# Patient Record
Sex: Male | Born: 1945 | Smoking: Current every day smoker
Health system: Southern US, Community
[De-identification: ages and names within clinical notes are randomized; demographics above are authoritative.]

## PROBLEM LIST (undated history)

## (undated) DIAGNOSIS — E119 Type 2 diabetes mellitus without complications: Secondary | ICD-10-CM

## (undated) DIAGNOSIS — I10 Essential (primary) hypertension: Secondary | ICD-10-CM

## (undated) DIAGNOSIS — J449 Chronic obstructive pulmonary disease, unspecified: Secondary | ICD-10-CM

## (undated) DIAGNOSIS — I219 Acute myocardial infarction, unspecified: Secondary | ICD-10-CM

## (undated) DIAGNOSIS — I2510 Atherosclerotic heart disease of native coronary artery without angina pectoris: Secondary | ICD-10-CM

## (undated) DIAGNOSIS — E785 Hyperlipidemia, unspecified: Secondary | ICD-10-CM

## (undated) HISTORY — DX: Type 2 diabetes mellitus without complications: E11.9

## (undated) HISTORY — PX: CORONARY ARTERY BYPASS GRAFT: SHX141

## (undated) HISTORY — DX: Essential (primary) hypertension: I10

## (undated) HISTORY — PX: TONSILLECTOMY: SUR1361

## (undated) HISTORY — DX: Atherosclerotic heart disease of native coronary artery without angina pectoris: I25.10

## (undated) HISTORY — DX: Hyperlipidemia, unspecified: E78.5

## (undated) HISTORY — DX: Chronic obstructive pulmonary disease, unspecified: J44.9

## (undated) HISTORY — DX: Acute myocardial infarction, unspecified: I21.9

---

## 2012-09-01 LAB — LIPID PANEL
Cholesterol: 214 mg/dL — AB (ref 0–200)
Triglycerides: 163 mg/dL — AB (ref 40–160)

## 2012-09-01 LAB — CBC AND DIFFERENTIAL
Platelets: 200 10*3/uL (ref 150–399)
WBC: 9.3 10^3/mL

## 2012-09-01 LAB — BASIC METABOLIC PANEL
Creatinine: 1.2 mg/dL (ref 0.6–1.3)
Potassium: 4 mmol/L (ref 3.4–5.3)

## 2013-01-15 ENCOUNTER — Ambulatory Visit: Payer: Self-pay | Admitting: Family Medicine

## 2013-01-19 ENCOUNTER — Ambulatory Visit (INDEPENDENT_AMBULATORY_CARE_PROVIDER_SITE_OTHER): Payer: Medicare Other | Admitting: Family Medicine

## 2013-01-19 ENCOUNTER — Encounter: Payer: Self-pay | Admitting: Family Medicine

## 2013-01-19 VITALS — BP 122/71 | HR 65 | Ht 72.0 in | Wt 282.0 lb

## 2013-01-19 DIAGNOSIS — Z951 Presence of aortocoronary bypass graft: Secondary | ICD-10-CM

## 2013-01-19 DIAGNOSIS — E1129 Type 2 diabetes mellitus with other diabetic kidney complication: Secondary | ICD-10-CM | POA: Insufficient documentation

## 2013-01-19 DIAGNOSIS — N400 Enlarged prostate without lower urinary tract symptoms: Secondary | ICD-10-CM | POA: Insufficient documentation

## 2013-01-19 DIAGNOSIS — R011 Cardiac murmur, unspecified: Secondary | ICD-10-CM

## 2013-01-19 DIAGNOSIS — I10 Essential (primary) hypertension: Secondary | ICD-10-CM | POA: Insufficient documentation

## 2013-01-19 DIAGNOSIS — J449 Chronic obstructive pulmonary disease, unspecified: Secondary | ICD-10-CM

## 2013-01-19 DIAGNOSIS — K219 Gastro-esophageal reflux disease without esophagitis: Secondary | ICD-10-CM

## 2013-01-19 DIAGNOSIS — F411 Generalized anxiety disorder: Secondary | ICD-10-CM

## 2013-01-19 DIAGNOSIS — E119 Type 2 diabetes mellitus without complications: Secondary | ICD-10-CM

## 2013-01-19 MED ORDER — TIOTROPIUM BROMIDE MONOHYDRATE 18 MCG IN CAPS: 18.0000 ug | ORAL_CAPSULE | Freq: Every day | RESPIRATORY_TRACT | Status: DC

## 2013-01-19 MED ORDER — FLUOXETINE HCL 20 MG PO CAPS: 40.0000 mg | ORAL_CAPSULE | Freq: Every day | ORAL | Status: DC

## 2013-01-19 MED ORDER — POTASSIUM CHLORIDE ER 10 MEQ PO CPCR: 10.0000 meq | ORAL_CAPSULE | Freq: Every day | ORAL | Status: DC

## 2013-01-19 MED ORDER — TAMSULOSIN HCL 0.4 MG PO CAPS: 0.4000 mg | ORAL_CAPSULE | Freq: Every day | ORAL | Status: DC

## 2013-01-19 MED ORDER — PITAVASTATIN CALCIUM 4 MG PO TABS: 4.0000 mg | ORAL_TABLET | Freq: Every day | ORAL | Status: DC

## 2013-01-19 MED ORDER — FUROSEMIDE 20 MG PO TABS: 20.0000 mg | ORAL_TABLET | Freq: Every day | ORAL | Status: DC

## 2013-01-19 MED ORDER — CLONAZEPAM 1 MG PO TABS: 1.0000 mg | ORAL_TABLET | Freq: Three times a day (TID) | ORAL | Status: DC | PRN

## 2013-01-19 MED ORDER — LISINOPRIL-HYDROCHLOROTHIAZIDE 20-25 MG PO TABS: 1.0000 | ORAL_TABLET | Freq: Every day | ORAL | Status: DC

## 2013-01-19 MED ORDER — METFORMIN HCL 500 MG PO TABS: 500.0000 mg | ORAL_TABLET | Freq: Two times a day (BID) | ORAL | Status: DC

## 2013-01-19 NOTE — Progress Notes (Signed)
CC: Maxwell Hoffman is a 67 y.o. male is here for Establish Care and needs refills on medicaitions   Subjective: HPI:  Very pleasant 67 year old here to establish care with out acute complaints  He is requesting refills on the majority of his medications  History of coronary artery disease status post CABG 2006. Over the past year he denies any chest pain or limb claudication. He is taking an aspirin a day, he is on a beta blocker, he is on statin he believes his last cholesterol was checked in January he is unsure of the results. He's pretty sure he was not at goal on other statin medication  History of COPD: He continues to smoke, he plans on quitting tomorrow using patches. He reports fatigue with walking the length of asile that improves with resting. Has been using Spiriva daily occasionally uses albuterol does not believe he's been using inhaled corticosteroid for the past year. Denies worsening productive cough or worsening shortness of breath from his subjective Baseline  History of essential hypertension: No outside blood pressures to report has been taking atenolol lisinopril and hydrochlorothiazide for over a year.  History of type 2 diabetes present for over 3 years taking only metformin twice a day he is quite conscious about carbohydrates minimizing intake. No formal exercise routine. He denies tingling or numbness of extremities nor burning sensation. Denies motor or sensory disturbances, vision loss, nor poorly healing wounds  Describes a history of anxiety that has been present ever since [place]. Symptoms are worsened by the recent end of one of his daughter's marriage. Symptoms are slightly improved since this occurred 3 months ago. Symptoms overall are greatly improved by as needed Klonopin and daily Prozac. Denies depression, nor mental disturbance otherwise  BPH: He has been taking Flomax on a daily basis for the last year with a great improvement of prior urinary  frequency and waking at night to urinate. He does not wake at all during the night to urinate anymore. He denies urinary hesitancy nor sensation of incomplete voiding  Review of Systems - General ROS: negative for - chills, fever, night sweats, weight gain or weight loss Ophthalmic ROS: negative for - decreased vision Psychological ROS: negative for - r depression ENT ROS: negative for - hearing change, nasal congestion, tinnitus or allergies Hematological and Lymphatic ROS: negative for - bleeding problems, bruising or swollen lymph nodes Breast ROS: negative Respiratory ROS: no cough, shortness of breath, or wheezing Cardiovascular ROS: no chest pain  Gastrointestinal ROS: no abdominal pain, change in bowel habits, or black or bloody stools Genito-Urinary ROS: negative for - genital discharge, genital ulcers, incontinence or abnormal bleeding from genitals Musculoskeletal ROS: negative for - joint pain or muscle pain Neurological ROS: negative for - headaches or memory loss Dermatological ROS: negative for lumps, mole changes, rash and skin lesion changes Past Medical History  Diagnosis Date  . Hypertension   . Hyperlipidemia   . Heart attack   . Diabetes      Family History  Problem Relation Age of Onset  . Hypertension Father      History  Substance Use Topics  . Smoking status: Current Every Day Smoker    Types: Cigarettes  . Smokeless tobacco: Not on file  . Alcohol Use: Yes     Objective: Filed Vitals:   01/19/13 1023  BP: 122/71  Pulse: 65    General: Alert and Oriented, No Acute Distress HEENT: Pupils equal, round, reactive to light. Conjunctivae clear.  Moist mucous  membrane was unremarkable pharynx Lungs: Clear to auscultation bilaterally, no wheezing/ronchi/rales.  Comfortable work of breathing. Good air movement. Cardiac: Regular rate and rhythm. Normal S1/S2.  No  rubs, nor gallops.  Grade 2/6 holosystolic murmur heard best over left second intercostal  space without carotid bruit Abdomen: Obese soft nontender Extremities: No peripheral edema.  Strong peripheral pulses.  Mental Status: No depression, anxiety, nor agitation. Skin: Warm and dry.  Assessment & Plan: Maxwell Hoffman was seen today for establish care and needs refills on medicaitions.  Diagnoses and associated orders for this visit:  S/P CABG x 3 (2006) - potassium chloride (MICRO-K) 10 MEQ CR capsule; Take 1 capsule (10 mEq total) by mouth daily. - furosemide (LASIX) 20 MG tablet; Take 1 tablet (20 mg total) by mouth daily. - Pitavastatin Calcium (LIVALO) 4 MG TABS; Take 1 tablet (4 mg total) by mouth daily.  Type 2 diabetes mellitus - metFORMIN (GLUCOPHAGE) 500 MG tablet; Take 1 tablet (500 mg total) by mouth 2 (two) times daily with a meal. - Hemoglobin A1c - COMPLETE METABOLIC PANEL WITH GFR - Microalbumin / creatinine urine ratio  BPH (benign prostatic hyperplasia) - tamsulosin (FLOMAX) 0.4 MG CAPS; Take 1 capsule (0.4 mg total) by mouth daily. - COMPLETE METABOLIC PANEL WITH GFR  Essential hypertension, benign - lisinopril-hydrochlorothiazide (PRINZIDE,ZESTORETIC) 20-25 MG per tablet; Take 1 tablet by mouth daily. - COMPLETE METABOLIC PANEL WITH GFR  GERD (gastroesophageal reflux disease)  Generalized anxiety disorder - FLUoxetine (PROZAC) 20 MG capsule; Take 2 capsules (40 mg total) by mouth daily. - clonazePAM (KLONOPIN) 1 MG tablet; Take 1 tablet (1 mg total) by mouth 3 (three) times daily as needed for anxiety.  COPD, moderate - tiotropium (SPIRIVA) 18 MCG inhalation capsule; Place 1 capsule (18 mcg total) into inhaler and inhale daily.  Other Orders - Discontinue: potassium chloride (MICRO-K) 10 MEQ CR capsule; Take 10 mEq by mouth daily. - Discontinue: furosemide (LASIX) 20 MG tablet; Take 20 mg by mouth daily. - Discontinue: metFORMIN (GLUCOPHAGE) 500 MG tablet; Take 500 mg by mouth 2 (two) times daily with a meal. - Discontinue: tamsulosin (FLOMAX) 0.4 MG  CAPS; Take 0.4 mg by mouth. - aspirin 81 MG tablet; Take 81 mg by mouth daily. - Discontinue: lisinopril-hydrochlorothiazide (PRINZIDE,ZESTORETIC) 20-25 MG per tablet; Take 1 tablet by mouth daily. - Cholecalciferol (VITAMIN D-3) 5000 UNITS TABS; Take by mouth. - fish oil-omega-3 fatty acids 1000 MG capsule; Take 2 g by mouth 2 (two) times daily. - Multiple Vitamin (MULTIVITAMIN) tablet; Take 1 tablet by mouth daily. - ranitidine (ZANTAC) 75 MG tablet; Take 75 mg by mouth 2 (two) times daily. - atenolol (TENORMIN) 100 MG tablet; Take 100 mg by mouth daily. - Discontinue: FLUoxetine (PROZAC) 20 MG capsule; Take 20 mg by mouth 2 (two) times daily. - Discontinue: Pitavastatin Calcium (LIVALO) 4 MG TABS; Take by mouth. - Discontinue: tiotropium (SPIRIVA) 18 MCG inhalation capsule; Place 18 mcg into inhaler and inhale daily. - Discontinue: clonazePAM (KLONOPIN) 1 MG tablet; Take 1 mg by mouth 2 (two) times daily as needed for anxiety. - Albuterol Sulfate (VENTOLIN HFA IN); Inhale into the lungs.    History of coronary artery disease status post CABG: Clinically stable, due for lipid panel he would prefer to have this at another time after outside records are reviewed History of type 2 diabetes: Clinically controlled due for A1c, due for urine micro-albumen BPH: Clinically controlled, continue Flomax Essential hypertension: Control, continue atenolol,  hydrochlorothiazide, lisinopril Generalized anxiety disorder: Stable controlled, continue Prozac and as  needed Klonopin COPD: Clinically controlled, congratulated his desire to quit, shortness of breath does not improve after his cessation of smoking return in 2 weeks to discuss inhaled corticosteroids. Cardiac murmur: Awaiting outside echocardiogram results he is unsure of the date  45 minutes spent face-to-face during visit today of which at least 50% was counseling or coordinating care regarding type 2 diabetes, coronary artery disease, BPH,  essential hypertension, generalized anxiety disorder, COPD.   Return in about 3 months (around 04/21/2013).

## 2013-01-20 ENCOUNTER — Encounter: Payer: Self-pay | Admitting: Family Medicine

## 2013-01-20 LAB — COMPLETE METABOLIC PANEL WITH GFR
AST: 22 U/L (ref 0–37)
Albumin: 4.6 g/dL (ref 3.5–5.2)
Alkaline Phosphatase: 54 U/L (ref 39–117)
Calcium: 10.9 mg/dL — ABNORMAL HIGH (ref 8.4–10.5)
Chloride: 97 mEq/L (ref 96–112)
Glucose, Bld: 166 mg/dL — ABNORMAL HIGH (ref 70–99)
Potassium: 3.9 mEq/L (ref 3.5–5.3)
Sodium: 140 mEq/L (ref 135–145)
Total Protein: 7.1 g/dL (ref 6.0–8.3)

## 2013-01-30 ENCOUNTER — Ambulatory Visit: Payer: Medicare Other | Admitting: Family Medicine

## 2013-02-20 ENCOUNTER — Ambulatory Visit: Payer: Medicare HMO | Admitting: Family Medicine

## 2013-02-24 ENCOUNTER — Encounter: Payer: Self-pay | Admitting: Family Medicine

## 2013-02-24 ENCOUNTER — Encounter: Payer: Self-pay | Admitting: *Deleted

## 2013-02-24 LAB — PSA: PSA: 1.99

## 2013-03-17 ENCOUNTER — Telehealth: Payer: Self-pay | Admitting: *Deleted

## 2013-03-17 DIAGNOSIS — E785 Hyperlipidemia, unspecified: Secondary | ICD-10-CM

## 2013-03-17 NOTE — Telephone Encounter (Signed)
Pt called and would like to get a lab order for chol.He is scheduling an appt for Friday and he plans to go get fasting lab work done tomorrow. Lab order faxed

## 2013-03-18 LAB — LIPID PANEL
Cholesterol: 229 mg/dL — ABNORMAL HIGH (ref 0–200)
Total CHOL/HDL Ratio: 5.2 Ratio
Triglycerides: 327 mg/dL — ABNORMAL HIGH (ref <150)

## 2013-03-20 ENCOUNTER — Ambulatory Visit: Payer: Medicare HMO | Admitting: Family Medicine

## 2013-03-23 ENCOUNTER — Encounter: Payer: Self-pay | Admitting: Family Medicine

## 2013-03-23 ENCOUNTER — Ambulatory Visit (INDEPENDENT_AMBULATORY_CARE_PROVIDER_SITE_OTHER): Payer: Medicare HMO | Admitting: Family Medicine

## 2013-03-23 VITALS — BP 144/81 | HR 76 | Wt 284.0 lb

## 2013-03-23 DIAGNOSIS — R011 Cardiac murmur, unspecified: Secondary | ICD-10-CM

## 2013-03-23 DIAGNOSIS — I10 Essential (primary) hypertension: Secondary | ICD-10-CM

## 2013-03-23 DIAGNOSIS — Z951 Presence of aortocoronary bypass graft: Secondary | ICD-10-CM

## 2013-03-23 DIAGNOSIS — F411 Generalized anxiety disorder: Secondary | ICD-10-CM

## 2013-03-23 DIAGNOSIS — E785 Hyperlipidemia, unspecified: Secondary | ICD-10-CM

## 2013-03-23 MED ORDER — FLUOXETINE HCL 20 MG PO CAPS: 60.0000 mg | ORAL_CAPSULE | Freq: Every day | ORAL | Status: DC

## 2013-03-23 MED ORDER — ATORVASTATIN CALCIUM 80 MG PO TABS: 80.0000 mg | ORAL_TABLET | Freq: Every day | ORAL | Status: DC

## 2013-03-23 NOTE — Progress Notes (Signed)
CC: Maxwell Hoffman is a 67 y.o. male is here for f/u labs   Subjective: HPI:  Follow hyperlipidemia: Has been taking livalo 4 mg daily basis for the past 6 months. Denies right upper quadrant pain myalgias nor skin or scleral discoloration. Is trying to get into a walking regimen but having trouble with shortness of breath. Tries to watch what he eats but admits eating largest meal of the day about an hour before sleeping. He does not believe he continued to afford this medication due to price  Essential hypertension: Continues on Lasix atenolol and Lasix no outside blood pressures to report. Has stopped taking lisinopril-hydrochlorothiazide due to intolerant urinary frequency. He is taking old lisinopril regimen on a daily basis he is unsure of the dosage. Denies chest pain, orthopnea, peripheral edema or motor sensory disturbances  Followup general anxiety disorder: Continues on Prozac 40 mg a day. He said his anxiety seems to be worsened since I saw him last due to having to do with discipline concerns of his grandchildren. He is also reporting subjective gloomyness and depression mild in severity relative to absent when I saw him last. Symptoms are significantly improved the more he spends outside. He would like to go back to his 60 mg regimen of Prozac  Patient complains of leg weakness and shortness of breath that occurs after walking a distance of his yard to the mailbox. Symptoms are moderate in severity are absent rest reproduced only with the above. Denies exertional chest pain nor limb claudication. They are present on daily basis this is been worsening over the past 2 months. Denies wheezing, cough, PND nor joint pain    Review Of Systems Outlined In HPI  Past Medical History  Diagnosis Date  . Hypertension   . Hyperlipidemia   . Heart attack   . Diabetes      Family History  Problem Relation Age of Onset  . Hypertension Father      History  Substance Use Topics  . Smoking  status: Current Every Day Smoker    Types: Cigarettes  . Smokeless tobacco: Not on file  . Alcohol Use: Yes     Objective: Filed Vitals:   03/23/13 0958  BP: 144/81  Pulse: 76    General: Alert and Oriented, No Acute Distress HEENT: Pupils equal, round, reactive to light. Conjunctivae clear.  Moist mucous membranes pharynx unremarkable Lungs: Clear to auscultation bilaterally, no wheezing/ronchi/rales.  Comfortable work of breathing. Good air movement. Cardiac: Regular rate and rhythm. Normal S1/S2.  Holosystolic murmur heard best over second left intercostal space nonradiating no rubs Abdomen: Obese soft nontender Extremities: No peripheral edema.  Strong peripheral pulses.  Mental Status: No depression, anxiety, nor agitation. Skin: Warm and dry.  Assessment & Plan: Maxwell Hoffman was seen today for f/u labs.  Diagnoses and associated orders for this visit:  Hyperlipidemia LDL goal < 70 - atorvastatin (LIPITOR) 80 MG tablet; Take 1 tablet (80 mg total) by mouth daily.  Essential hypertension, benign  Generalized anxiety disorder - FLUoxetine (PROZAC) 20 MG capsule; Take 3 capsules (60 mg total) by mouth daily.  S/P CABG x 3 (2006) - 2D Echocardiogram without contrast; Future - Ambulatory referral to Cardiology  Murmur - 2D Echocardiogram without contrast; Future - Ambulatory referral to Cardiology    Hyperlipidemia: uncontrolled chronic condition LDL of 120 less than 70, financially he'll have to stop livalo we will start full dose atorvastatin recheck LDL 3 months Essential hypertension: Uncontrolled, he did not take his medication this  morning, encouraged to take medications before coming to his next visit in one month we'll titrate medications as needed, asked him to call me with his dose of lisinopril Generalized anxiety disorder: Uncontrolled chronic condition will restart former dose of Prozac 60 mg daily readdress in one month Shortness of breath in the setting of  murmur and known heart disease: We will order an echocardiogram today he is agreeable to cardiology referral who has not seen in over a decade   Return in about 4 weeks (around 04/20/2013) for diabetes .

## 2013-03-24 ENCOUNTER — Telehealth: Payer: Self-pay | Admitting: *Deleted

## 2013-03-24 NOTE — Telephone Encounter (Signed)
Prior auth obtained through [facility] for 2D echo.  Authorization number [id number].

## 2013-03-25 ENCOUNTER — Telehealth: Payer: Self-pay | Admitting: *Deleted

## 2013-03-25 ENCOUNTER — Encounter: Payer: Self-pay | Admitting: *Deleted

## 2013-03-25 ENCOUNTER — Inpatient Hospital Stay (HOSPITAL_BASED_OUTPATIENT_CLINIC_OR_DEPARTMENT_OTHER): Admission: RE | Admit: 2013-03-25 | Payer: Medicare HMO | Source: Ambulatory Visit

## 2013-03-25 NOTE — Telephone Encounter (Signed)
Pt called and he is taking lisinopril 20 mg. This has been added to his med list

## 2013-04-01 ENCOUNTER — Ambulatory Visit (HOSPITAL_BASED_OUTPATIENT_CLINIC_OR_DEPARTMENT_OTHER)
Admission: RE | Admit: 2013-04-01 | Discharge: 2013-04-01 | Disposition: A | Discharge status: Home / Self Care | Payer: Medicare HMO | Source: Ambulatory Visit | Attending: Family Medicine | Admitting: Family Medicine

## 2013-04-01 DIAGNOSIS — J449 Chronic obstructive pulmonary disease, unspecified: Secondary | ICD-10-CM | POA: Insufficient documentation

## 2013-04-01 DIAGNOSIS — I080 Rheumatic disorders of both mitral and aortic valves: Secondary | ICD-10-CM | POA: Insufficient documentation

## 2013-04-01 DIAGNOSIS — E785 Hyperlipidemia, unspecified: Secondary | ICD-10-CM | POA: Insufficient documentation

## 2013-04-01 DIAGNOSIS — I079 Rheumatic tricuspid valve disease, unspecified: Secondary | ICD-10-CM | POA: Insufficient documentation

## 2013-04-01 DIAGNOSIS — F17200 Nicotine dependence, unspecified, uncomplicated: Secondary | ICD-10-CM | POA: Insufficient documentation

## 2013-04-01 DIAGNOSIS — Z951 Presence of aortocoronary bypass graft: Secondary | ICD-10-CM

## 2013-04-01 DIAGNOSIS — E669 Obesity, unspecified: Secondary | ICD-10-CM | POA: Insufficient documentation

## 2013-04-01 DIAGNOSIS — J4489 Other specified chronic obstructive pulmonary disease: Secondary | ICD-10-CM | POA: Insufficient documentation

## 2013-04-01 DIAGNOSIS — I059 Rheumatic mitral valve disease, unspecified: Secondary | ICD-10-CM

## 2013-04-01 DIAGNOSIS — R011 Cardiac murmur, unspecified: Secondary | ICD-10-CM

## 2013-04-01 DIAGNOSIS — E119 Type 2 diabetes mellitus without complications: Secondary | ICD-10-CM | POA: Insufficient documentation

## 2013-04-01 DIAGNOSIS — I10 Essential (primary) hypertension: Secondary | ICD-10-CM | POA: Insufficient documentation

## 2013-04-01 NOTE — Progress Notes (Signed)
  Echocardiogram 2D Echocardiogram has been performed.  Cathie Beams 04/01/2013, 10:46 AM

## 2013-04-09 ENCOUNTER — Ambulatory Visit (INDEPENDENT_AMBULATORY_CARE_PROVIDER_SITE_OTHER): Payer: Medicare HMO | Admitting: Family Medicine

## 2013-04-09 ENCOUNTER — Encounter: Payer: Self-pay | Admitting: Family Medicine

## 2013-04-09 VITALS — BP 131/82 | HR 61 | Wt 277.0 lb

## 2013-04-09 DIAGNOSIS — I059 Rheumatic mitral valve disease, unspecified: Secondary | ICD-10-CM

## 2013-04-09 DIAGNOSIS — J449 Chronic obstructive pulmonary disease, unspecified: Secondary | ICD-10-CM

## 2013-04-09 DIAGNOSIS — I340 Nonrheumatic mitral (valve) insufficiency: Secondary | ICD-10-CM

## 2013-04-09 DIAGNOSIS — E119 Type 2 diabetes mellitus without complications: Secondary | ICD-10-CM

## 2013-04-09 LAB — POCT GLYCOSYLATED HEMOGLOBIN (HGB A1C): Hemoglobin A1C: 5.9

## 2013-04-09 NOTE — Progress Notes (Signed)
CC: Coree Brame is a 67 y.o. male is here for discuss echo results and Diabetes   Subjective: HPI:  Patient accompanied by daughter  Patient is requesting clarification of echocardiogram that was ordered 2 weeks ago due to a murmur in the setting of shortness of breath and muscle fatigue with exertion. He plans on starting any intimate relationship with a friend in [place] and is wondering if his heart is healthy enough for sex. Today he reports his shortness of breath is absent he denies dyspnea on exertion but continues to have lower extremity weakness without cramping or claudication nor limb discomfort with elevation. He and his daughter had many well thought out questions regarding his echocardiogram. He denies recent or remote exertional chest pain over the last 2-3 year. He is actively quitting smoking.  He admits to frequent coughing however this is improving as smoking has cut back he denies wheezing orthopnea nor peripheral edema.  Followup type 2 diabetes: He has been taking metformin 500 mg twice a day without vision loss, motor sensory disturbances, polyuria polyphasia or polydipsia    Review Of Systems Outlined In HPI  Past Medical History  Diagnosis Date  . Hypertension   . Hyperlipidemia   . Heart attack   . Diabetes      Family History  Problem Relation Age of Onset  . Hypertension Father      History  Substance Use Topics  . Smoking status: Current Every Day Smoker    Types: Cigarettes  . Smokeless tobacco: Not on file  . Alcohol Use: Yes     Objective: Filed Vitals:   04/09/13 1017  BP: 131/82  Pulse: 61    Vital signs reviewed. General: Alert and Oriented, No Acute Distress HEENT: Pupils equal, round, reactive to light. Conjunctivae clear.  External ears unremarkable.  Moist mucous membranes. Lungs: Clear and comfortable work of breathing, speaking in full sentences without accessory muscle use. No wheezing rhonchi nor rales Cardiac: Regular  rate and rhythm. Grade 2 holosystolic murmur over left second intercostal space Neuro: CN II-XII grossly intact, gait normal. Extremities: No peripheral edema.  Strong peripheral pulses.  Mental Status: No depression, anxiety, nor agitation. Logical though process. Skin: Warm and dry.  Assessment & Plan: Trinidad was seen today for discuss echo results and diabetes.  Diagnoses and associated orders for this visit:  Type 2 diabetes mellitus - POCT HgB A1C - Urine Microalbumin w/creat. ratio  COPD, moderate  Mitral regurgitation    Type 2 diabetes: A1c 5.9 well controlled continue metformin COPD: Stable We discussed starting an inhaled corticosteroid should shortness of breath return however given lack of shortness of breath he would prefer to avoid this at this time Mitral regurgitation: Time was taken to discuss the patient and his daughter findings of echocardiogram showing EF 60% mild mitral and tricuspid regurgitation but overall no other significant abnormalities.  Have encouraged patient to engage in daily walking regimen lengthening the duration overall 10% a week, we discussed that he would definitely need cardiac stress test should he develop chest pain or dyspnea on exertion and we discussed signs and symptoms of limb claudication it would require further ABI. He has a cardiology appointment in 2 weeks and we'll continue discussing her health and whether or not he can engage in sex, we discussed that it's likely a stress test would be needed before clearance for sexual activity  40 minutes spent face-to-face during visit today of which at least 50% was counseling or coordinating care  regarding mitral regurgitation, COPD, type 2 diabetes, exercise/sexual counseling.   Return in about 4 weeks (around 05/07/2013).

## 2013-04-10 ENCOUNTER — Encounter: Payer: Self-pay | Admitting: Family Medicine

## 2013-04-10 DIAGNOSIS — R809 Proteinuria, unspecified: Secondary | ICD-10-CM | POA: Insufficient documentation

## 2013-04-10 LAB — MICROALBUMIN / CREATININE URINE RATIO: Microalb Creat Ratio: 60.6 mg/g — ABNORMAL HIGH (ref 0.0–30.0)

## 2013-04-14 ENCOUNTER — Ambulatory Visit: Payer: Medicare HMO | Admitting: Family Medicine

## 2013-04-22 ENCOUNTER — Encounter: Payer: Self-pay | Admitting: Cardiology

## 2013-04-22 ENCOUNTER — Other Ambulatory Visit: Payer: Self-pay | Admitting: Family Medicine

## 2013-04-22 ENCOUNTER — Ambulatory Visit (INDEPENDENT_AMBULATORY_CARE_PROVIDER_SITE_OTHER): Payer: Medicare HMO | Admitting: Cardiology

## 2013-04-22 VITALS — BP 116/72 | HR 60 | Ht 72.0 in | Wt 262.0 lb

## 2013-04-22 DIAGNOSIS — Z720 Tobacco use: Secondary | ICD-10-CM

## 2013-04-22 DIAGNOSIS — R0989 Other specified symptoms and signs involving the circulatory and respiratory systems: Secondary | ICD-10-CM

## 2013-04-22 DIAGNOSIS — E785 Hyperlipidemia, unspecified: Secondary | ICD-10-CM

## 2013-04-22 DIAGNOSIS — I059 Rheumatic mitral valve disease, unspecified: Secondary | ICD-10-CM

## 2013-04-22 DIAGNOSIS — Z951 Presence of aortocoronary bypass graft: Secondary | ICD-10-CM

## 2013-04-22 DIAGNOSIS — I2510 Atherosclerotic heart disease of native coronary artery without angina pectoris: Secondary | ICD-10-CM

## 2013-04-22 DIAGNOSIS — F17200 Nicotine dependence, unspecified, uncomplicated: Secondary | ICD-10-CM

## 2013-04-22 DIAGNOSIS — I340 Nonrheumatic mitral (valve) insufficiency: Secondary | ICD-10-CM

## 2013-04-22 DIAGNOSIS — I10 Essential (primary) hypertension: Secondary | ICD-10-CM

## 2013-04-22 NOTE — Assessment & Plan Note (Signed)
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

## 2013-04-22 NOTE — Progress Notes (Signed)
HPI: 67 year-old male for evaluation of murmur and CAD. Patient is status post myocardial infarction followed by PCI in 1993 and 1998. In 2006 he had coronary artery bypass graft. All of those procedures were performed in [place]. No records available. He was recently found to have a murmur. Echocardiogram in August of 2014 showed normal LV function and mild left ventricular hypertrophy. The aortic root was mildly dilated. There was moderate left atrial enlargement and mild mitral regurgitation. He denies dyspnea, chest pain, palpitations or syncope.  Current Outpatient Prescriptions  Medication Sig Dispense Refill  . Albuterol Sulfate (VENTOLIN HFA IN) Inhale into the lungs.      Marland Kitchen aspirin 81 MG tablet Take 81 mg by mouth daily.      Marland Kitchen atenolol (TENORMIN) 100 MG tablet Take 100 mg by mouth daily.      Marland Kitchen atorvastatin (LIPITOR) 80 MG tablet Take 1 tablet (80 mg total) by mouth daily.  90 tablet  3  . Cholecalciferol (VITAMIN D-3) 5000 UNITS TABS Take by mouth.      . clonazePAM (KLONOPIN) 1 MG tablet Take 1 tablet (1 mg total) by mouth 3 (three) times daily as needed for anxiety.  90 tablet  2  . fish oil-omega-3 fatty acids 1000 MG capsule Take 2 g by mouth 2 (two) times daily.      Marland Kitchen FLUoxetine (PROZAC) 20 MG capsule Take 3 capsules (60 mg total) by mouth daily.  270 capsule  1  . furosemide (LASIX) 20 MG tablet Take 1 tablet (20 mg total) by mouth daily.  90 tablet  1  . lisinopril (PRINIVIL,ZESTRIL) 20 MG tablet Take 20 mg by mouth daily.      . metFORMIN (GLUCOPHAGE) 500 MG tablet Take 1 tablet (500 mg total) by mouth 2 (two) times daily with a meal.  180 tablet  1  . Multiple Vitamins-Minerals (CENTRUM SILVER ADULT 50+ PO) Take by mouth daily.      . potassium chloride (MICRO-K) 10 MEQ CR capsule Take 1 capsule (10 mEq total) by mouth daily.  90 capsule  1  . ranitidine (ZANTAC) 75 MG tablet Take 75 mg by mouth 2 (two) times daily.      . tamsulosin (FLOMAX) 0.4 MG CAPS Take 1 capsule  (0.4 mg total) by mouth daily.  90 capsule  1  . tiotropium (SPIRIVA) 18 MCG inhalation capsule Place 1 capsule (18 mcg total) into inhaler and inhale daily.  30 capsule  5   No current facility-administered medications for this visit.    Allergies  Allergen Reactions  . Other     ANT  . Wax [Beeswax]     Past Medical History  Diagnosis Date  . Hypertension   . Hyperlipidemia   . Heart attack   . Diabetes   . CAD (coronary artery disease)   . COPD (chronic obstructive pulmonary disease)     Past Surgical History  Procedure Laterality Date  . Coronary artery bypass graft      x 3  . Tonsillectomy      History   Social History  . Marital Status: Widowed    Spouse Name: N/A    Number of Children: 3  . Years of Education: N/A   Occupational History  .      Retired   Social History Main Topics  . Smoking status: Current Every Day Smoker    Types: Cigarettes  . Smokeless tobacco: Not on file  . Alcohol Use: Yes  Comment: 3 drinks per night  . Drug Use: Not on file  . Sexual Activity: Not Currently   Other Topics Concern  . Not on file   Social History Narrative  . No narrative on file    Family History  Problem Relation Age of Onset  . Hypertension Father     ROS: no fevers or chills, productive cough, hemoptysis, dysphasia, odynophagia, melena, hematochezia, dysuria, hematuria, rash, seizure activity, orthopnea, PND, pedal edema, claudication. Remaining systems are negative.  Physical Exam:   Blood pressure 116/72, pulse 60, height 6' (1.829 m), weight 262 lb (118.842 kg).  General:  Well developed/well nourished in NAD Skin warm/dry Patient not depressed No peripheral clubbing Back-normal HEENT-normal/normal eyelids Neck supple/normal carotid upstroke bilaterally; no bruits; no JVD; no thyromegaly chest - CTA/ normal expansion CV - RRR/normal S1 and S2; no rubs or gallops;  PMI nondisplaced, 2/6 systolic murmur left sternal border. S2 is not  diminished. 2/6 systolic murmur apex. Abdomen -NT/ND, no HSM, no mass, + bowel sounds, positive bruit 2+ femoral pulses, no bruits Ext-no edema, chords, 2+ DP Neuro-grossly nonfocal  ECG sinus rhythm at a rate of 60. Occasional PVCs. Normal axis. RV conduction delay. Cannot rule out prior septal infarct.

## 2013-04-22 NOTE — Assessment & Plan Note (Signed)
Blood pressure controlled. Continue present medications. Potassium and renal function monitored by primary care. 

## 2013-04-22 NOTE — Telephone Encounter (Signed)
Maxwell Hoffman, Rx placed in in-box ready for pickup/faxing.  

## 2013-04-22 NOTE — Assessment & Plan Note (Signed)
Patient counseled on discontinuing. 

## 2013-04-22 NOTE — Patient Instructions (Addendum)
Your physician wants you to follow-up in: ONE YEAR WITH DR Jens Som IN [place] You will receive a reminder letter in the mail two months in advance. If you don't receive a letter, please call our office to schedule the follow-up appointment.   Your physician has requested that you have an abdominal aorta duplex. During this test, an ultrasound is used to evaluate the aorta. Allow 30 minutes for this exam. Do not eat after midnight the day before and avoid carbonated beverages   Your physician has requested that you have a lexiscan myoview. For further information please visit [email or web address] Please follow instruction sheet, as given.

## 2013-04-22 NOTE — Assessment & Plan Note (Signed)
Continue aspirin and statin. Schedule Myoview for risk stratification. 

## 2013-04-22 NOTE — Assessment & Plan Note (Signed)
Schedule ultrasound to exclude abdominal aortic aneurysm.

## 2013-04-22 NOTE — Assessment & Plan Note (Signed)
Lipids and liver monitored by primary care. Continue statin.

## 2013-04-22 NOTE — Telephone Encounter (Signed)
rx faxed

## 2013-04-24 ENCOUNTER — Ambulatory Visit: Payer: Medicare HMO | Admitting: Family Medicine

## 2013-04-28 ENCOUNTER — Ambulatory Visit (INDEPENDENT_AMBULATORY_CARE_PROVIDER_SITE_OTHER): Payer: Medicare HMO | Admitting: Family Medicine

## 2013-04-28 ENCOUNTER — Encounter: Payer: Self-pay | Admitting: Family Medicine

## 2013-04-28 ENCOUNTER — Ambulatory Visit (INDEPENDENT_AMBULATORY_CARE_PROVIDER_SITE_OTHER): Payer: Medicare HMO

## 2013-04-28 VITALS — BP 140/82 | HR 59 | Wt 273.0 lb

## 2013-04-28 DIAGNOSIS — M25519 Pain in unspecified shoulder: Secondary | ICD-10-CM

## 2013-04-28 DIAGNOSIS — M25511 Pain in right shoulder: Secondary | ICD-10-CM

## 2013-04-28 DIAGNOSIS — Z23 Encounter for immunization: Secondary | ICD-10-CM

## 2013-04-28 NOTE — Progress Notes (Signed)
CC: Maxwell Hoffman is a 67 y.o. male is here for neck pain on the rt side and rt shouler pain   Subjective: HPI:  Complains of right shoulder pain localized to the top of the shoulder that radiates to the lateral aspect of the shoulder and also towards the neck traveling along the upper trapezius. Symptoms came on 2-3 weeks ago have been present on a daily basis but for some unknown reason for absent right now, he said no interventions as of yet. Denies trauma but did replace the toilet just prior to onset of symptoms. Pain is described only as pain it is mild in severity. He denies motor or sensory disturbances in the right upper extremity nor any were for that matter. He reports objective full range of motion in the right shoulder. Approximately one year ago he fractured the head of the humerus.  He's never had this pain before. It can be present any time of the day. Denies chest pain, shortness of breath, nor warmth swelling or redness of the affected joint, he's positive the pain does not radiate beyond his shoulder distally   Review Of Systems Outlined In HPI  Past Medical History  Diagnosis Date  . Hypertension   . Hyperlipidemia   . Heart attack   . Diabetes   . CAD (coronary artery disease)   . COPD (chronic obstructive pulmonary disease)      Family History  Problem Relation Age of Onset  . Hypertension Father      History  Substance Use Topics  . Smoking status: Current Every Day Smoker    Types: Cigarettes  . Smokeless tobacco: Not on file  . Alcohol Use: Yes     Comment: 3 drinks per night     Objective: Filed Vitals:   04/28/13 1443  BP: 140/82  Pulse: 59    General: Alert and Oriented, No Acute Distress HEENT: Pupils equal, round, reactive to light. Conjunctivae clear.  Moist mucous membranes pharynx unremarkable Lungs: Clear to auscultation bilaterally, no wheezing/ronchi/rales.  Comfortable work of breathing. Good air movement. Cardiac: Regular rate and  rhythm. Normal S1/S2.  No murmurs, rubs, nor gallops.   Extremities: No peripheral edema.  Strong peripheral pulses. Exam of the right upper extremity: Juanetta Hoffman is positive, empty can positive, negative speeds, negative crossarm, negative Neer's, no pain with palpation of a.c. joint, pain is reproduced with internal rotation of the humerus especially behind the back. He has full range of motion and strength of all 4 rotator cuff Back: There is no C-spine spinous process tenderness nor pain with palpation of the trapezius muscle belly on the right Mental Status: No depression, anxiety, nor agitation. Skin: Warm and dry.  Assessment & Plan: Amol was seen today for neck pain on the rt side and rt shouler pain.  Diagnoses and associated orders for this visit:  Right shoulder pain - DG Shoulder Right; Future    There was a suspicion of right rotator cuff tendinopathy/tendinitis an x-ray was obtained for a better view of the inferior a.c. joint which is unremarkable. Discussed oral and intra-articular inflammatory options patient prefers injection today, triamcinolone delivered without complication. Signs and symptoms requring emergent/urgent reevaluation were discussed with the patient.  He will receive flu shot today  Return if symptoms worsen or fail to improve.  Subacromial Shoulder Injection Procedure Note  Pre-operative Diagnosis: right rotator cuff tendinitis  Post-operative Diagnosis: same  Indications: pain  Anesthesia: topical cold spray  Procedure Details   Verbal consent was obtained  for the procedure. The shoulder was prepped with Betadine and the skin was anesthetized. A 25 gauge needle was advanced into the subacromial space through posterior approach without difficulty  The space was then injected with 2ml 1% lidocaine and 2 ml of triamcinolone (KENALOG) 40mg /ml. The injection site was cleansed with isopropyl alcohol and a dressing was applied.  Complications:  None;  patient tolerated the procedure well.

## 2013-05-06 ENCOUNTER — Ambulatory Visit (HOSPITAL_COMMUNITY): Payer: Medicare HMO | Attending: Cardiovascular Disease | Admitting: Radiology

## 2013-05-06 ENCOUNTER — Ambulatory Visit (HOSPITAL_BASED_OUTPATIENT_CLINIC_OR_DEPARTMENT_OTHER): Payer: Medicare HMO

## 2013-05-06 VITALS — BP 121/83 | Ht 72.0 in | Wt 270.0 lb

## 2013-05-06 DIAGNOSIS — I252 Old myocardial infarction: Secondary | ICD-10-CM | POA: Insufficient documentation

## 2013-05-06 DIAGNOSIS — R0609 Other forms of dyspnea: Secondary | ICD-10-CM | POA: Insufficient documentation

## 2013-05-06 DIAGNOSIS — I2510 Atherosclerotic heart disease of native coronary artery without angina pectoris: Secondary | ICD-10-CM

## 2013-05-06 DIAGNOSIS — I10 Essential (primary) hypertension: Secondary | ICD-10-CM | POA: Insufficient documentation

## 2013-05-06 DIAGNOSIS — E119 Type 2 diabetes mellitus without complications: Secondary | ICD-10-CM | POA: Insufficient documentation

## 2013-05-06 DIAGNOSIS — E785 Hyperlipidemia, unspecified: Secondary | ICD-10-CM | POA: Insufficient documentation

## 2013-05-06 DIAGNOSIS — R0602 Shortness of breath: Secondary | ICD-10-CM

## 2013-05-06 DIAGNOSIS — F17200 Nicotine dependence, unspecified, uncomplicated: Secondary | ICD-10-CM | POA: Insufficient documentation

## 2013-05-06 DIAGNOSIS — Z951 Presence of aortocoronary bypass graft: Secondary | ICD-10-CM

## 2013-05-06 DIAGNOSIS — R0989 Other specified symptoms and signs involving the circulatory and respiratory systems: Secondary | ICD-10-CM | POA: Insufficient documentation

## 2013-05-06 DIAGNOSIS — R002 Palpitations: Secondary | ICD-10-CM | POA: Insufficient documentation

## 2013-05-06 MED ORDER — TECHNETIUM TC 99M SESTAMIBI GENERIC - CARDIOLITE
33.0000 | Freq: Once | INTRAVENOUS | Status: AC | PRN
  Administered 2013-05-06: 33 via INTRAVENOUS

## 2013-05-06 MED ORDER — REGADENOSON 0.4 MG/5ML IV SOLN
0.4000 mg | Freq: Once | INTRAVENOUS | Status: AC
  Administered 2013-05-06: 0.4 mg via INTRAVENOUS

## 2013-05-06 MED ORDER — TECHNETIUM TC 99M SESTAMIBI GENERIC - CARDIOLITE
11.0000 | Freq: Once | INTRAVENOUS | Status: AC | PRN
  Administered 2013-05-06: 11 via INTRAVENOUS

## 2013-05-06 NOTE — Progress Notes (Signed)
[facility] SITE 3 NUCLEAR MED [address] [place], [place] [address] [phone]    Cardiology Nuclear Med Study  Maxwell Hoffman is a 67 y.o. 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 IV 0.9% NS with Angio Cath:  22g  IV Site: L Antecubital x 1, tolerated well IV Started by:  Irean Hong, RN  Chest Size (in):  50 Cup Size: n/a  Height: 6' (1.829 m)  Weight:  270 lb (122.471 kg)  BMI:  Body mass index is 36.61 kg/(m^2). Tech Comments:  Atenolol this am; held metformin and lasix    Nuclear Med Study 1 or 2 day study: 1 day  Stress Test Type:  Eugenie Birks  Reading MD: Charlton Haws, MD  Order Authorizing Provider:  Olga Millers, MD  Resting Radionuclide: Technetium 13m Sestamibi  Resting Radionuclide Dose: 11.0 mCi   Stress Radionuclide:  Technetium 41m Sestamibi  Stress Radionuclide Dose: 33.0 mCi           Stress Protocol Rest HR: 54 Stress HR: 67  Rest BP: 120/83 Stress BP: 139/78  Exercise Time (min): 2:00 METS: 1.6   Predicted Max HR: 154 bpm % Max HR: 43.51 bpm Rate Pressure Product: 9313   Dose of Adenosine (mg):  n/a Dose of Lexiscan: 0.4 mg  Dose of Atropine (mg): n/a Dose of Dobutamine: n/a mcg/kg/min (at max HR)  Stress Test Technologist: Milana Na, EMT-P  Nuclear Technologist:  Domenic Polite, CNMT     Rest Procedure:  Myocardial perfusion imaging was performed at rest 45 minutes following the intravenous administration of Technetium 13m Sestamibi. Rest ECG: NSR [id number]  Stress Procedure:  The patient received IV Lexiscan 0.4 mg over  15-seconds with concurrent low level exercise and then Technetium 33m Sestamibi was injected at 30-seconds while the patient continued walking one more minute.  Quantitative spect images were obtained after a 45-minute delay. Stress ECG: No significant change from baseline ECG  QPS Raw Data Images:  Normal; no motion artifact; normal heart/lung ratio. Stress Images:  Decreased uptake in inferolateral wall Rest Images:  Decreased uptake in inferolateral wall Subtraction (SDS):  There is a fixed defect that is most consistent with a previous infarction. Transient Ischemic Dilatation (Normal <1.22):  n/a Lung/Heart Ratio (Normal <0.45):  0.47  Quantitative Gated Spect Images QGS EDV:  257 ml QGS ESV:  110 ml  Impression Exercise Capacity:  Lexiscan with low level exercise. BP Response:  Normal blood pressure response. Clinical Symptoms:  There is dyspnea. ECG Impression:  No significant ST segment change suggestive of ischemia. 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  LV Ejection Fraction: 57%.  LV Wall Motion:  Calculated as normal but LV appears dilated and extent of infarct suggests lower EF   [employer]

## 2013-05-07 ENCOUNTER — Telehealth: Payer: Self-pay | Admitting: *Deleted

## 2013-05-07 MED ORDER — LISINOPRIL 20 MG PO TABS: 20.0000 mg | ORAL_TABLET | Freq: Every day | ORAL | Status: DC

## 2013-05-07 NOTE — Telephone Encounter (Signed)
Patient's daughter states that the pt needs a refill on his lisinopril.  I don't see that we've filled it before, are you ok with this? Please advise

## 2013-05-07 NOTE — Telephone Encounter (Signed)
Perfectly fine, sent to [facility] on [address]

## 2013-05-07 NOTE — Telephone Encounter (Signed)
Pt.notified

## 2013-05-08 ENCOUNTER — Telehealth: Payer: Self-pay | Admitting: Cardiology

## 2013-05-08 NOTE — Telephone Encounter (Signed)
Pt was to call debra if not heard from her by today, stress test Wednesday, wants results

## 2013-05-08 NOTE — Telephone Encounter (Signed)
Spoke with pt, aware of ultrasound and nuclear results

## 2013-05-15 ENCOUNTER — Telehealth: Payer: Self-pay

## 2013-05-15 MED ORDER — AMOXICILLIN-POT CLAVULANATE 500-125 MG PO TABS: ORAL_TABLET | ORAL | Status: DC

## 2013-05-15 NOTE — Telephone Encounter (Signed)
Left message on vm to call back and let us know what pharm we need to fax rx into

## 2013-05-15 NOTE — Telephone Encounter (Signed)
Pt called back at 210 and left a message stating he saw that I called and to call him back; see previous phone note

## 2013-05-15 NOTE — Telephone Encounter (Signed)
Maxwell Hoffman, Rx placed in in-box ready for pickup/faxing. I'm not sure if [name] is in [place] or still in town right now.

## 2013-05-15 NOTE — Telephone Encounter (Signed)
Patient states his tooth is worse now and would like an antibiotic. He going to try to see the Dentist when he returns from [place].

## 2013-05-25 ENCOUNTER — Telehealth: Payer: Self-pay

## 2013-05-25 DIAGNOSIS — M25511 Pain in right shoulder: Secondary | ICD-10-CM

## 2013-05-25 DIAGNOSIS — K0889 Other specified disorders of teeth and supporting structures: Secondary | ICD-10-CM

## 2013-05-25 MED ORDER — AMOXICILLIN-POT CLAVULANATE 500-125 MG PO TABS: ORAL_TABLET | ORAL | Status: AC

## 2013-05-25 MED ORDER — AMOXICILLIN-POT CLAVULANATE 500-125 MG PO TABS: ORAL_TABLET | ORAL | Status: DC

## 2013-05-25 MED ORDER — MELOXICAM 15 MG PO TABS: 15.0000 mg | ORAL_TABLET | Freq: Every day | ORAL | Status: DC | PRN

## 2013-05-25 NOTE — Telephone Encounter (Signed)
Rx of meloxicam for pain and augmentin for tooth infection sent to [facility] on [address]

## 2013-05-25 NOTE — Telephone Encounter (Signed)
He is back from [place] and would like and antibiotic called in for his tooth. He would also like something for his pain. See last note.   The pharmacy is [facility].

## 2013-05-26 NOTE — Telephone Encounter (Signed)
Pt.notified

## 2013-06-01 ENCOUNTER — Telehealth: Payer: Self-pay | Admitting: Family Medicine

## 2013-06-01 MED ORDER — CLONAZEPAM 1 MG PO TABS: ORAL_TABLET | ORAL | Status: DC

## 2013-06-01 NOTE — Telephone Encounter (Signed)
[place] controlled sub database reviewed

## 2013-06-02 ENCOUNTER — Ambulatory Visit: Payer: Medicare HMO | Admitting: Family Medicine

## 2013-06-03 ENCOUNTER — Ambulatory Visit (INDEPENDENT_AMBULATORY_CARE_PROVIDER_SITE_OTHER): Payer: Medicare HMO | Admitting: Family Medicine

## 2013-06-03 ENCOUNTER — Telehealth: Payer: Self-pay | Admitting: Family Medicine

## 2013-06-03 ENCOUNTER — Encounter: Payer: Self-pay | Admitting: Family Medicine

## 2013-06-03 VITALS — BP 124/77 | HR 60 | Temp 98.0°F | Wt 263.0 lb

## 2013-06-03 DIAGNOSIS — I5023 Acute on chronic systolic (congestive) heart failure: Secondary | ICD-10-CM

## 2013-06-03 DIAGNOSIS — G4733 Obstructive sleep apnea (adult) (pediatric): Secondary | ICD-10-CM

## 2013-06-03 DIAGNOSIS — J9620 Acute and chronic respiratory failure, unspecified whether with hypoxia or hypercapnia: Secondary | ICD-10-CM

## 2013-06-03 DIAGNOSIS — I509 Heart failure, unspecified: Secondary | ICD-10-CM | POA: Insufficient documentation

## 2013-06-03 DIAGNOSIS — Z09 Encounter for follow-up examination after completed treatment for conditions other than malignant neoplasm: Secondary | ICD-10-CM

## 2013-06-03 DIAGNOSIS — N400 Enlarged prostate without lower urinary tract symptoms: Secondary | ICD-10-CM

## 2013-06-03 DIAGNOSIS — I10 Essential (primary) hypertension: Secondary | ICD-10-CM

## 2013-06-03 MED ORDER — TAMSULOSIN HCL 0.4 MG PO CAPS: 0.4000 mg | ORAL_CAPSULE | Freq: Every day | ORAL | Status: DC

## 2013-06-03 MED ORDER — DOXAZOSIN MESYLATE 4 MG PO TABS: 4.0000 mg | ORAL_TABLET | Freq: Every day | ORAL | Status: DC

## 2013-06-03 MED ORDER — FREESTYLE SYSTEM KIT: 1.0000 | PACK | Status: AC | PRN

## 2013-06-03 NOTE — Progress Notes (Signed)
CC: Maxwell Hoffman is a 67 y.o. male is here for hospital f/u   Subjective: HPI:  Hospital followup [facility] October 23-24  Patient reports that when he was visiting [place] for the past 2 weeks he has stopped taking Lasix due to urinary frequency and urinary incontinence due to urgency. He believes he did not take Lasix for most of the time he was away from home. The longer that he was on vacation the more he noticed that he was becoming fatigued and developing shortness of breath. Shortness of breath got to the point where he was becoming confused and short of breath even at rest described as severe in severity.  This prompted EMS to arrive at his house on the 23rd. He was admitted for dyspnea in the setting of acute on chronic systolic congestive heart failure and acute on chronic respiratory failure  Acute on chronic systolic congestive heart failure: An echocardiogram was obtained showing 40-45% EF left ventricle along with severe mitral regurgitation. This was obtained prior to diuresis. 6 pounds of fluid was diuresis with IV furosemide receive twice a day. He was discharged approximately 36 hours after admission after there was no change in renal function and where serial troponins were negative.  He states he feels remarkably better ever since the day of discharge since fluid was pulled off of him. Prior to admission he was experiencing abdominal bloating, PND, and orthopnea without peripheral edema. This is now completely resolved 100%. He continues on Lasix 20 mg a day taken every morning. He reports that he is urinating every 15 minutes and often has small accidents symptoms are worse in the 4 hours after taking Lasix but still moderate to severe in the later part of the day. Denies dysuria but admits to sensation of incomplete voiding  Acute on chronic respiratory failure: Oxygen saturation was below 89% on admission at discharge it was 90% on room air. Patient states he is  currently experiencing no shortness of breath whatsoever. Denies fever, chills, cough, wheezing since time of discharge.   Review Of Systems Outlined In HPI  Past Medical History  Diagnosis Date  . Hypertension   . Hyperlipidemia   . Heart attack   . Diabetes   . CAD (coronary artery disease)   . COPD (chronic obstructive pulmonary disease)      Family History  Problem Relation Age of Onset  . Hypertension Father      History  Substance Use Topics  . Smoking status: Current Every Day Smoker    Types: Cigarettes  . Smokeless tobacco: Not on file  . Alcohol Use: Yes     Comment: 3 drinks per night     Objective: Filed Vitals:   06/03/13 1113  BP: 124/77  Pulse: 60  Temp: 98 F (36.7 C)    General: Alert and Oriented, No Acute Distress HEENT: Pupils equal, round, reactive to light. Conjunctivae clear.  Moist mucous membranes pharynx unremarkable Lungs: Clear to auscultation bilaterally, no wheezing/ronchi/rales.  Comfortable work of breathing. Good air movement. Cardiac: Regular rate and rhythm. Normal S1/S2.  No rubs or gallops, there is a holosystolic 2/6 murmur heard at the second left intercostal space no carotid bruits  Abdomen: Obese soft nontender Extremities: No peripheral edema.  Strong peripheral pulses.  Mental Status: No depression, anxiety, nor agitation. Skin: Warm and dry.  Assessment & Plan: [name] was seen today for hospital f/u.  Diagnoses and associated orders for this visit:  Hospital discharge follow-up  Essential hypertension,  benign - BASIC METABOLIC PANEL WITH GFR  Acute on chronic systolic heart failure - BASIC METABOLIC PANEL WITH GFR  Acute-on-chronic respiratory failure  BPH (benign prostatic hyperplasia) - doxazosin (CARDURA) 4 MG tablet; Take 1 tablet (4 mg total) by mouth at bedtime. To help with prostate. - tamsulosin (FLOMAX) 0.4 MG CAPS capsule; Take 1 capsule (0.4 mg total) by mouth daily.  CHF, chronic  Other  Orders - glucose monitoring kit (FREESTYLE) monitoring kit; 1 each by Does not apply route as needed for other. Use to test blood sugar fasting daily.   Please dispense test strips and lancets to last 30 days.    Patient.patient and his daughter had many very well thought out questions regarding results that were not reviewed with him while hospitalized. Time was taken to answer all questions. Discussed that findings on echocardiogram while admitted had deteriorated relative to that which was obtained in the summer. Discussed with at least a part of these findings would be due to being fluid overloaded however I did stress followup with his cardiologist as I suspect repeat echocardiogram may be warranted in a euvolemic state. Encouraged him to continue on atenolol and lisinopril and Lasix 20 mg. Total he does urinary frequency is due to less than optimal control of his BPH, start doxazosin 2 mg daily if not improving urinary symptoms Increase to 4 mg daily provided blood pressure tolerates this, nor agrees to help with daily blood pressure monitoring. Encouraged patient to weigh himself daily and to take extra dose of Lasix if he gains more than 2 pounds overnight also to followup with that occurs.  Acute congestive heart failure and respiratory failure seems to have resolved.  They've requested a prescription for glucometer and would like his CPAP machine assessed as they are worried may be malfunctioning, no particular complaints about how it may be malfunctioning  60 minutes spent face-to-face during visit today of which at least 50% was counseling or coordinating care regarding acute on chronic systolic congestive heart failure, acute on chronic respiratory failure, BPH   Return in about 4 weeks (around 07/01/2013) for BPH FU.

## 2013-06-03 NOTE — Telephone Encounter (Signed)
Sue Lush, Can you see if [facility] can help with a CPAP problem Mr. Sexson is having.  He thinks his unit may be outdated and malfunctioning.  It was originally given to him by a company 20 years ago in [place] where he no longer resides.

## 2013-06-04 NOTE — Telephone Encounter (Signed)
So...unfortonatley the only agency that will work with [employer] is [employer]. [employer] will repair and machine every five years as long as it doesn't exceed the cost of the machine. This requires a face to face visit where where this is discussed and an order

## 2013-06-05 MED ORDER — AMBULATORY NON FORMULARY MEDICATION: Status: DC

## 2013-06-05 NOTE — Telephone Encounter (Signed)
I think our visit from earlier this week can count as a face to face, can you please relay a verbal order to inspect and repair his machine.

## 2013-06-05 NOTE — Telephone Encounter (Signed)
Called [employer] and they do not repair old machine.pt will have to get a new one. Faxed order to [phone]

## 2013-06-08 ENCOUNTER — Ambulatory Visit: Payer: Medicare HMO | Admitting: Cardiology

## 2013-06-09 ENCOUNTER — Ambulatory Visit: Payer: Medicare HMO | Admitting: Family Medicine

## 2013-06-09 NOTE — Telephone Encounter (Signed)
Sue Lush, Will you please let Mr. Jagoda know that Maxwell Hoffman is requiring a sleep study so that his insurance will cover a new CPAP machine.  This was never received from outside records which i'm not surprised about since it was probably done so long ago.  Unless he has the original sleep study data he'll need a new sleep study.  In case this is needed i've placed an order through [facility], they'll call him about scheduling.

## 2013-06-10 NOTE — Telephone Encounter (Signed)
Left detailed message on vm.

## 2013-06-17 ENCOUNTER — Ambulatory Visit (INDEPENDENT_AMBULATORY_CARE_PROVIDER_SITE_OTHER): Payer: Medicare HMO | Admitting: Cardiology

## 2013-06-17 ENCOUNTER — Encounter: Payer: Self-pay | Admitting: Cardiology

## 2013-06-17 VITALS — BP 132/80 | HR 64 | Ht 72.0 in | Wt 268.8 lb

## 2013-06-17 DIAGNOSIS — E785 Hyperlipidemia, unspecified: Secondary | ICD-10-CM

## 2013-06-17 DIAGNOSIS — I059 Rheumatic mitral valve disease, unspecified: Secondary | ICD-10-CM

## 2013-06-17 DIAGNOSIS — I2510 Atherosclerotic heart disease of native coronary artery without angina pectoris: Secondary | ICD-10-CM

## 2013-06-17 DIAGNOSIS — I509 Heart failure, unspecified: Secondary | ICD-10-CM

## 2013-06-17 DIAGNOSIS — I10 Essential (primary) hypertension: Secondary | ICD-10-CM

## 2013-06-17 DIAGNOSIS — Z720 Tobacco use: Secondary | ICD-10-CM

## 2013-06-17 DIAGNOSIS — F17200 Nicotine dependence, unspecified, uncomplicated: Secondary | ICD-10-CM

## 2013-06-17 DIAGNOSIS — I340 Nonrheumatic mitral (valve) insufficiency: Secondary | ICD-10-CM

## 2013-06-17 NOTE — Assessment & Plan Note (Signed)
Continue aspirin and statin. 

## 2013-06-17 NOTE — Assessment & Plan Note (Signed)
Continue statin. 

## 2013-06-17 NOTE — Assessment & Plan Note (Signed)
Patient continues to have a significant mitral regurgitation murmur on examination. Recent echocardiogram at [facility] suggested severe MR. We will obtain those records for review. I will see him back in 4 weeks. He may require transesophageal echocardiogram to better assess the severity of MR, morphology of valve and whether he will need mitral valve surgery.

## 2013-06-17 NOTE — Patient Instructions (Signed)
Your physician recommends that you continue on your current medications as directed. Please refer to the Current Medication list given to you today.   Your physician recommends that you schedule a follow-up appointment in: 4 weeks with Dr. Jens Som on Wednesday, December 17 @ 3:00

## 2013-06-17 NOTE — Assessment & Plan Note (Signed)
Patient counseled on discontinuing. 

## 2013-06-17 NOTE — Assessment & Plan Note (Signed)
Patient's CHF symptoms are much improved now that he has resumed his Lasix. Check potassium and renal function today.

## 2013-06-17 NOTE — Progress Notes (Signed)
HPI: FU CAD. Patient is status post myocardial infarction followed by PCI in 1993 and 1998. In 2006 he had coronary artery bypass graft. All of those procedures were performed in [place]. No records available. Echocardiogram in August of 2014 showed normal LV function and mild left ventricular hypertrophy. The aortic root was mildly dilated. There was moderate left atrial enlargement and mild mitral regurgitation. Abdominal ultrasound in October of 2014 showed no aneurysm. Nuclear study in October of 2014 showed an inferior lateral infarct but no ischemia. Ejection fraction was 57% with left ventricular enlargement. Patient admitted to [facility] in October of 2014 with congestive heart failure. He had discontinued his Lasix on a trip to [place]. An echocardiogram by report showed an ejection fraction of 40-45% with severe mitral regurgitation. He was diuresed with improvement. Since that time has mild dyspnea on exertion but back to baseline. No orthopnea, PND, pedal edema, chest pain or syncope.   Current Outpatient Prescriptions  Medication Sig Dispense Refill  . Albuterol Sulfate (VENTOLIN HFA IN) Inhale into the lungs.      . AMBULATORY NON FORMULARY MEDICATION CPAP machine  1 each  0  . aspirin 81 MG tablet Take 81 mg by mouth daily.      Marland Kitchen atenolol (TENORMIN) 100 MG tablet Take 100 mg by mouth daily.      Marland Kitchen atorvastatin (LIPITOR) 80 MG tablet Take 1 tablet (80 mg total) by mouth daily.  90 tablet  3  . Cholecalciferol (VITAMIN D-3) 5000 UNITS TABS Take by mouth.      . clonazePAM (KLONOPIN) 1 MG tablet TAKE 1 TABLET BY MOUTH THREE TIMES DAILY AS NEEDED ANXIETY  90 tablet  0  . doxazosin (CARDURA) 4 MG tablet Take 2 mg by mouth at bedtime. To help with prostate.      . fish oil-omega-3 fatty acids 1000 MG capsule Take 2 g by mouth 2 (two) times daily.      Marland Kitchen FLUoxetine (PROZAC) 20 MG capsule Take 3 capsules (60 mg total) by mouth daily.  270 capsule  1  . furosemide (LASIX)  20 MG tablet Take 1 tablet (20 mg total) by mouth daily.  90 tablet  1  . glucose monitoring kit (FREESTYLE) monitoring kit 1 each by Does not apply route as needed for other. Use to test blood sugar fasting daily.   Please dispense test strips and lancets to last 30 days.  1 each  0  . lisinopril (PRINIVIL,ZESTRIL) 20 MG tablet Take 1 tablet (20 mg total) by mouth daily.  90 tablet  0  . metFORMIN (GLUCOPHAGE) 500 MG tablet Take 1 tablet (500 mg total) by mouth 2 (two) times daily with a meal.  180 tablet  1  . Multiple Vitamins-Minerals (CENTRUM SILVER ADULT 50+ PO) Take by mouth daily.      . potassium chloride (MICRO-K) 10 MEQ CR capsule Take 1 capsule (10 mEq total) by mouth daily.  90 capsule  1  . ranitidine (ZANTAC) 75 MG tablet Take 75 mg by mouth 2 (two) times daily.      . tamsulosin (FLOMAX) 0.4 MG CAPS capsule Take 1 capsule (0.4 mg total) by mouth daily.  90 capsule  1  . tiotropium (SPIRIVA) 18 MCG inhalation capsule Place 1 capsule (18 mcg total) into inhaler and inhale daily.  30 capsule  5   No current facility-administered medications for this visit.     Past Medical History  Diagnosis Date  . Hypertension   .  Hyperlipidemia   . Heart attack   . Diabetes   . CAD (coronary artery disease)   . COPD (chronic obstructive pulmonary disease)     Past Surgical History  Procedure Laterality Date  . Coronary artery bypass graft      x 3  . Tonsillectomy      History   Social History  . Marital Status: Widowed    Spouse Name: N/A    Number of Children: 3  . Years of Education: N/A   Occupational History  .      Retired   Social History Main Topics  . Smoking status: Current Every Day Smoker    Types: Cigarettes  . Smokeless tobacco: Not on file  . Alcohol Use: Yes     Comment: 3 drinks per night  . Drug Use: Not on file  . Sexual Activity: Not Currently   Other Topics Concern  . Not on file   Social History Narrative  . No narrative on file    ROS:  no fevers or chills, productive cough, hemoptysis, dysphasia, odynophagia, melena, hematochezia, dysuria, hematuria, rash, seizure activity, orthopnea, PND, pedal edema, claudication. Remaining systems are negative.  Physical Exam: Well-developed well-nourished in no acute distress.  Skin is warm and dry.  HEENT is normal.  Neck is supple.  Chest is clear to auscultation with normal expansion.  Cardiovascular exam is regular rate and rhythm. 3/6 systolic murmur apex. Abdominal exam nontender or distended. No masses palpated. Extremities show no edema. neuro grossly intact

## 2013-06-17 NOTE — Assessment & Plan Note (Signed)
Blood pressure controlled. Continue present medications. 

## 2013-06-18 ENCOUNTER — Encounter: Payer: Self-pay | Admitting: Family Medicine

## 2013-06-18 ENCOUNTER — Ambulatory Visit (INDEPENDENT_AMBULATORY_CARE_PROVIDER_SITE_OTHER): Payer: Medicare HMO | Admitting: Family Medicine

## 2013-06-18 VITALS — BP 141/86 | HR 53 | Wt 270.0 lb

## 2013-06-18 DIAGNOSIS — K089 Disorder of teeth and supporting structures, unspecified: Secondary | ICD-10-CM

## 2013-06-18 DIAGNOSIS — I2510 Atherosclerotic heart disease of native coronary artery without angina pectoris: Secondary | ICD-10-CM

## 2013-06-18 DIAGNOSIS — I10 Essential (primary) hypertension: Secondary | ICD-10-CM

## 2013-06-18 DIAGNOSIS — I059 Rheumatic mitral valve disease, unspecified: Secondary | ICD-10-CM

## 2013-06-18 DIAGNOSIS — N400 Enlarged prostate without lower urinary tract symptoms: Secondary | ICD-10-CM

## 2013-06-18 DIAGNOSIS — Z4589 Encounter for adjustment and management of other implanted devices: Secondary | ICD-10-CM

## 2013-06-18 DIAGNOSIS — Z951 Presence of aortocoronary bypass graft: Secondary | ICD-10-CM

## 2013-06-18 DIAGNOSIS — I340 Nonrheumatic mitral (valve) insufficiency: Secondary | ICD-10-CM

## 2013-06-18 DIAGNOSIS — K0889 Other specified disorders of teeth and supporting structures: Secondary | ICD-10-CM

## 2013-06-18 DIAGNOSIS — H6120 Impacted cerumen, unspecified ear: Secondary | ICD-10-CM

## 2013-06-18 LAB — BASIC METABOLIC PANEL WITH GFR
Chloride: 103 mEq/L (ref 96–112)
Creat: 1.04 mg/dL (ref 0.50–1.35)
GFR, Est African American: 86 mL/min
GFR, Est Non African American: 74 mL/min
Glucose, Bld: 94 mg/dL (ref 70–99)
Potassium: 4.9 mEq/L (ref 3.5–5.3)
Sodium: 140 mEq/L (ref 135–145)

## 2013-06-18 MED ORDER — FUROSEMIDE 20 MG PO TABS: ORAL_TABLET | ORAL | Status: DC

## 2013-06-18 MED ORDER — TRAMADOL HCL 50 MG PO TABS: 50.0000 mg | ORAL_TABLET | Freq: Two times a day (BID) | ORAL | Status: DC | PRN

## 2013-06-18 NOTE — Progress Notes (Signed)
CC: Maxwell Hoffman is a 67 y.o. male is here for Follow-up   Subjective: HPI:  BPH followup: Since starting Flomax and doxazosin 2 mg he has noticed a drastic improvement in BPH symptoms. Symptoms began to improve to-3 days after starting the above medications. He is awakening to urinate only once a night. He can go 2-3 hours between needing to urinate, he thinks that he is not urinating more than 8 times a day. He denies incomplete emptying sensation, dribbling, weak stream, urinary hesitancy, nor urinary urgency  Patient is worried about the possibility that he may need his mitral valve replaced, he was given this news yesterday by his cardiologist. He would like a second opinion with the cardiology team that took care of him while he was at [facility]    Followup essential hypertension: He has been taking his blood pressure at home is consistently systolic 110-120 diastolic 70s.    Complains of left ear pain described as mild in severity and itching. Not radiating. Improves with inserting a Q-tip into the ear nothing else makes better or worse. Has been present for one week becoming more annoying on a daily basis. Reports that he has tympanostomy tubes that were put in over 5 years ago.  Patient completed dental pain that has been present for one year current interventions included Panorex while at [facility] last month to rule out abscess. Pain is described only as pain moderate in severity worse first thing in the morning worse when chewing on the right side of the mouth localized to the most posterior and upper right molar. Has been unable to find local dentist that will take his insurance  Denies fevers, chills, unintentional weight gain or loss, shortness of breath, orthopnea, peripheral edema, abdominal bloating. Denies motor or sensory disturbances   Review Of Systems Outlined In HPI  Past Medical History  Diagnosis Date  . Hypertension   . Hyperlipidemia   .  Heart attack   . Diabetes   . CAD (coronary artery disease)   . COPD (chronic obstructive pulmonary disease)      Family History  Problem Relation Age of Onset  . Hypertension Father      History  Substance Use Topics  . Smoking status: Current Every Day Smoker    Types: Cigarettes  . Smokeless tobacco: Not on file  . Alcohol Use: Yes     Comment: 3 drinks per night     Objective: Filed Vitals:   06/18/13 1055  BP: 141/86  Pulse: 53    General: Alert and Oriented, No Acute Distress HEENT: Pupils equal, round, reactive to light. Conjunctivae clear.  External ears unremarkable, right canals clear with intact TMs and appropriate landmarks there is a blue tympanostomy tube at 7:00 position.  There is a mild cerumen impaction in the left ear canal obstructing the tympanic membrane . Pink inferior turbinates.  Moist mucous membranes, pharynx without inflammation nor lesions.  Neck supple without palpable lymphadenopathy nor abnormal masses. Poor dentition with multiple caries in all molars Lungs: Clear to auscultation bilaterally, no wheezing/ronchi/rales.  Comfortable work of breathing. Good air movement. Cardiac: Regular rate and rhythm. Grade 3/6 holosystolic murmur best heard at the left second intercostal space Extremities: No peripheral edema.  Strong peripheral pulses.  Mental Status: No depression, anxiety, nor agitation. Skin: Warm and dry.  Assessment & Plan: Maxwell Hoffman was seen today for follow-up.  Diagnoses and associated orders for this visit:  Essential hypertension, benign  S/P CABG x  3 (2006) - furosemide (LASIX) 20 MG tablet; 1-2 tablets by mouth every morning as needed for swelling. - Ambulatory referral to Cardiology  Pain, dental - traMADol (ULTRAM) 50 MG tablet; Take 1 tablet (50 mg total) by mouth every 12 (twelve) hours as needed.  Cerumen impaction, unspecified laterality - Ambulatory referral to ENT  Tympanostomy tube check - Ambulatory referral to  ENT  Mitral regurgitation - Ambulatory referral to Cardiology  CAD (coronary artery disease) - Ambulatory referral to Cardiology  BPH (benign prostatic hyperplasia)    Essential hypertension: Controlled continue doxazosin, lisinopril, atenolol Dental pain: Tramadol as needed encouraged by dentist as soon as possible avoid nonsteroidal anti-inflammatories given congestive heart failure Cerumen impaction: Referral to ENT, patient refuses to use hydrogen peroxide to loosen wax in left ear therefore referral has been placed as I'm afraid I may damage the tympanic membrane if the cerumen is adherent to the tympanostomy tube Mitral regurgitation: Referral to [facility] cardiology PPH: Controlled continue Flomax and doxazosin  40 minutes spent face-to-face during visit today of which at least 50% was counseling or coordinating care regarding essential hypertension, dental pain, cerumen impaction, mitral regurgitation, coronary artery disease BPH.   Return in about 4 weeks (around 07/16/2013).

## 2013-06-22 ENCOUNTER — Telehealth: Payer: Self-pay | Admitting: Cardiology

## 2013-06-22 NOTE — Telephone Encounter (Signed)
Records rec From [facility], Will Hold Onto Until Victorio Palm back in Office 06/22/13/KM

## 2013-06-22 NOTE — Telephone Encounter (Signed)
ROI faxed to [facility] at [phone]

## 2013-07-07 ENCOUNTER — Ambulatory Visit (INDEPENDENT_AMBULATORY_CARE_PROVIDER_SITE_OTHER): Payer: Medicare HMO | Admitting: Family Medicine

## 2013-07-07 ENCOUNTER — Encounter: Payer: Self-pay | Admitting: Family Medicine

## 2013-07-07 VITALS — BP 128/78 | HR 58 | Wt 268.0 lb

## 2013-07-07 DIAGNOSIS — L819 Disorder of pigmentation, unspecified: Secondary | ICD-10-CM

## 2013-07-07 DIAGNOSIS — R51 Headache: Secondary | ICD-10-CM

## 2013-07-07 MED ORDER — CYCLOBENZAPRINE HCL 10 MG PO TABS: ORAL_TABLET | ORAL | Status: DC

## 2013-07-07 MED ORDER — BUTALBITAL-APAP-CAFFEINE 50-325-40 MG PO TABS: 1.0000 | ORAL_TABLET | Freq: Four times a day (QID) | ORAL | Status: DC | PRN

## 2013-07-07 NOTE — Progress Notes (Signed)
CC: Maxwell Hoffman is a 67 y.o. male is here for Headaches   Subjective: HPI:  Complains of a daily headache that has been present for the last 2 weeks seems to be worse early in the morning described as tightness in the back of his neck that radiates up the back of his head into both temples described only as tightness and pressure. Improves with caffeine, has also tried ibuprofen with 10 mg of oxycodone that his daughter gave him this will take the edge off for approximately 8 hours. Denies motor or sensory disturbances prior during or after the headache. Denies vision loss or confusion prior during or after the headaches. Denies recent trauma to head. Denies fevers, chills, nasal congestion. Symptoms can occur anytime of the day. Moderate in severity at its worse.  He points out a lesion on his left forehead near the temple that has been present for an unknown amount of time. He seems to be itching him but not bothering him otherwise. No interventions as of yet. Symptoms are worse when wearing glasses denies history of skin cancer personal or family.   Review Of Systems Outlined In HPI  Past Medical History  Diagnosis Date  . Hypertension   . Hyperlipidemia   . Heart attack   . Diabetes   . CAD (coronary artery disease)   . COPD (chronic obstructive pulmonary disease)      Family History  Problem Relation Age of Onset  . Hypertension Father      History  Substance Use Topics  . Smoking status: Current Every Day Smoker    Types: Cigarettes  . Smokeless tobacco: Not on file  . Alcohol Use: Yes     Comment: 3 drinks per night     Objective: Filed Vitals:   07/07/13 1513  BP: 128/78  Pulse: 58    General: Alert and Oriented, No Acute Distress HEENT: Pupils equal, round, reactive to light. Conjunctivae clear.  Ex moist because membranes pharynx unremarkable Lungs: Clear to auscultation bilaterally, no wheezing/ronchi/rales.  Comfortable work of breathing. Good air  movement. Cardiac: Regular rate and rhythm. Normal S1/S2.  No murmurs, rubs, nor gallops.   Neuro: CN II-XII grossly intact, full strength/rom of all four extremities, C5/L4/S1 DTRs 2/4 bilaterally, gait normal, Extremities: No peripheral edema.  Strong peripheral pulses.  Mental Status: No depression, anxiety, nor agitation. Skin: Warm and dry. He has a slightly raised pearly lesion with a central dark hue with mild telangiectasia on the left temple this is approximately 0.5 millimeter raised and 4 mm diameter  Assessment & Plan: Maxwell Hoffman was seen today for headaches.  Diagnoses and associated orders for this visit:  Pigmented skin lesion - Dermatology pathology  Headache(784.0) - butalbital-acetaminophen-caffeine (FIORICET, ESGIC) 50-325-40 MG per tablet; Take 1 tablet by mouth every 6 (six) hours as needed for headache. - cyclobenzaprine (FLEXERIL) 10 MG tablet; Take a half to a full tab every 8-12 hours only as needed for neck spasm, may cause sedation.    Pigmented skin lesion that is suspicious for basal cell carcinoma a shave biopsy was obtained for definitive diagnosis Headache: Suspect tension headache, and urged to stop oxycodone instead tried Flexeril and if needed Fioricet. If not improving by Friday we'll provide prednisone taper as I suspect he might be having a rebound component to his headaches  Return in about 3 months (around 10/05/2013).  Shave Biopsy Procedure Note  Pre-operative Diagnosis: Suspicious lesion  Post-operative Diagnosis: same  Locations:Left temple region  Indications: rule out [facility]  Anesthesia: topical cold spray  Procedure Details  History of allergy to iodine: no  Patient informed of the risks (including bleeding and infection) and benefits of the  procedure and Verbal informed consent obtained.  The lesion and surrounding area were given a sterile prep using chlorhexidine and draped in the usual sterile fashion. A scalpel was used to shave an  area of skin approximately 0.5cm by 0.5cm.  Hemostasis achieved with pressure. Antibiotic ointment and a sterile dressing applied.  The specimen was sent for pathologic examination. The patient tolerated the procedure well.  EBL: 1 ml  Findings: Successful sample  Condition: Stable  Complications: none.  Plan: 1. Instructed to keep the wound dry and covered for 24-48h and clean thereafter. 2. Warning signs of infection were reviewed.   3. Recommended that the patient use OTC analgesics as needed for pain.  4. Return in 1 week. PRN

## 2013-07-10 ENCOUNTER — Other Ambulatory Visit: Payer: Self-pay | Admitting: Family Medicine

## 2013-07-10 DIAGNOSIS — C4491 Basal cell carcinoma of skin, unspecified: Secondary | ICD-10-CM

## 2013-07-13 ENCOUNTER — Telehealth: Payer: Self-pay | Admitting: *Deleted

## 2013-07-13 ENCOUNTER — Other Ambulatory Visit: Payer: Self-pay | Admitting: Family Medicine

## 2013-07-13 ENCOUNTER — Telehealth: Payer: Self-pay | Admitting: Family Medicine

## 2013-07-13 DIAGNOSIS — C4491 Basal cell carcinoma of skin, unspecified: Secondary | ICD-10-CM

## 2013-07-13 NOTE — Telephone Encounter (Signed)
Pt left a message stating he was scheduled for testing with Dr. Leeann Must( a cardiologist) for Tuesday morning. Pt states he doesn't feel the need to go the cardiologist and wanted to have a conversation with Dr. Ivan Anchors about it.Fowarding this note just make Dr. Ivan Anchors aware that pt prob will not keep his appt

## 2013-07-13 NOTE — Telephone Encounter (Signed)
Maxwell Hoffman, Will you please let Maxwell Hoffman know that his biopsy showed that his skin lesion is a basal cell carcinoma.  I've placed a dermatology referral so they can discuss surgical vs. Topical interventions to get rid of it.

## 2013-07-13 NOTE — Telephone Encounter (Signed)
Pt aware. Dr, Linford Arnold reviewed this Friday

## 2013-07-13 NOTE — Telephone Encounter (Signed)
Noted, If he calls back my recommendation would be to follow Dr. Gevena Barre recommendations.

## 2013-07-14 ENCOUNTER — Encounter: Payer: Self-pay | Admitting: Family Medicine

## 2013-07-14 ENCOUNTER — Other Ambulatory Visit: Payer: Self-pay | Admitting: Family Medicine

## 2013-07-15 ENCOUNTER — Encounter: Payer: Self-pay | Admitting: Family Medicine

## 2013-07-15 ENCOUNTER — Encounter (HOSPITAL_BASED_OUTPATIENT_CLINIC_OR_DEPARTMENT_OTHER): Payer: Medicare HMO

## 2013-07-15 DIAGNOSIS — G4733 Obstructive sleep apnea (adult) (pediatric): Secondary | ICD-10-CM | POA: Insufficient documentation

## 2013-07-15 DIAGNOSIS — J449 Chronic obstructive pulmonary disease, unspecified: Secondary | ICD-10-CM

## 2013-07-15 DIAGNOSIS — C4491 Basal cell carcinoma of skin, unspecified: Secondary | ICD-10-CM | POA: Insufficient documentation

## 2013-07-22 ENCOUNTER — Ambulatory Visit: Payer: Medicare HMO | Admitting: Cardiology

## 2013-07-23 ENCOUNTER — Ambulatory Visit: Payer: Medicare HMO | Admitting: Family Medicine

## 2013-07-31 ENCOUNTER — Encounter: Payer: Self-pay | Admitting: *Deleted

## 2013-08-05 ENCOUNTER — Telehealth: Payer: Self-pay | Admitting: *Deleted

## 2013-08-05 NOTE — Telephone Encounter (Signed)
PA obtained for Tamsulosin HCL. Good thru 08/05/2014.  Meyer Cory, LPN

## 2013-08-10 ENCOUNTER — Other Ambulatory Visit: Payer: Self-pay | Admitting: Family Medicine

## 2013-08-11 ENCOUNTER — Ambulatory Visit (INDEPENDENT_AMBULATORY_CARE_PROVIDER_SITE_OTHER): Payer: Medicare HMO | Admitting: Family Medicine

## 2013-08-11 ENCOUNTER — Encounter: Payer: Self-pay | Admitting: Family Medicine

## 2013-08-11 VITALS — BP 143/84 | HR 69 | Wt 270.0 lb

## 2013-08-11 DIAGNOSIS — K0889 Other specified disorders of teeth and supporting structures: Secondary | ICD-10-CM

## 2013-08-11 DIAGNOSIS — K089 Disorder of teeth and supporting structures, unspecified: Secondary | ICD-10-CM

## 2013-08-11 DIAGNOSIS — R51 Headache: Secondary | ICD-10-CM

## 2013-08-11 DIAGNOSIS — F411 Generalized anxiety disorder: Secondary | ICD-10-CM

## 2013-08-11 DIAGNOSIS — I509 Heart failure, unspecified: Secondary | ICD-10-CM

## 2013-08-11 DIAGNOSIS — I059 Rheumatic mitral valve disease, unspecified: Secondary | ICD-10-CM

## 2013-08-11 DIAGNOSIS — I340 Nonrheumatic mitral (valve) insufficiency: Secondary | ICD-10-CM

## 2013-08-11 MED ORDER — CYCLOBENZAPRINE HCL 10 MG PO TABS: ORAL_TABLET | ORAL | Status: DC

## 2013-08-11 MED ORDER — FLUOXETINE HCL 20 MG PO CAPS: 60.0000 mg | ORAL_CAPSULE | Freq: Every day | ORAL | Status: DC

## 2013-08-11 MED ORDER — TRAMADOL HCL 50 MG PO TABS: 100.0000 mg | ORAL_TABLET | Freq: Three times a day (TID) | ORAL | Status: DC | PRN

## 2013-08-11 MED ORDER — VENLAFAXINE HCL ER 75 MG PO CP24: ORAL_CAPSULE | ORAL | Status: DC

## 2013-08-11 NOTE — Progress Notes (Signed)
CC: Maxwell Hoffman is a 68 y.o. male is here for discuss cardiac questions   Subjective: HPI:  Followup congestive heart failure with mitral regurgitation: Patient returns after second opinion with Dr. Mauricio Hoffman who put forth a plan of transthoracic echocardiogram followed by possible catheterization to evaluate for median candidacy of mitral valve replacement due to severe mitral valve regurgitation.  Dr. Stanford Hoffman had also set the stage that the patient may need mitral valve replacement. Patient admits that he is taking aback and intimidated about the possibility of a surgery and feels that too much information is coming from at once. He is extremely reluctant about having any cardiac surgery in the near or distant future due to surgical complications that occurred with cardiac surgery in the past.  He is asking me today what he thinks he should do in his current situation. Since I saw him last he denies any shortness of breath, cough, abdominal bloating, chest pain, orthopnea, peripheral edema. He is weighing himself on a daily basis and taking Lasix most days of the week depending on whether or not he's gained more than 1 pound overnight.  Patient with continued headaches that are localized in the back of the head that radiate up towards both temples they occur most days of the week. It appears he is having rebound headaches when he is taking Fioricet which mildly improves the pain for only about one or 2 hours. Tramadol is helping somewhat. He denies any new character or severity to his headaches. He denies any motor or sensory disturbances. Flexeril seems to be providing him with the most benefit.  He feels that his ": Nerves " and depression are somewhat stable right now however he is wondering if his depression is considering to his reluctance of having any cardiac interventions    Review Of Systems Outlined In HPI  Past Medical History  Diagnosis Date  . Hypertension   . Hyperlipidemia   .  Heart attack   . Diabetes   . CAD (coronary artery disease)   . COPD (chronic obstructive pulmonary disease)      Family History  Problem Relation Age of Onset  . Hypertension Father      History  Substance Use Topics  . Smoking status: Current Every Day Smoker    Types: Cigarettes  . Smokeless tobacco: Not on file  . Alcohol Use: Yes     Comment: 3 drinks per night     Objective: Filed Vitals:   08/11/13 1311  BP: 143/84  Pulse: 69    General: Alert and Oriented, No Acute Distress HEENT: Pupils equal, round, reactive to light. Conjunctivae clear.   moist because membranes with poor dentition  Lungs: Clear to auscultation bilaterally, no wheezing/ronchi/rales.  Comfortable work of breathing. Good air movement. Cardiac: Regular rate and rhythm.  grade 3/6 crescendo decrescendo murmur heard over the left second intercostal space    Abdomen: Obese and soft  Extremities: No peripheral edema.  Strong peripheral pulses.  Mental Status: No depression, anxiety, nor agitation. Skin: Warm and dry.  Assessment & Plan: Maxwell Hoffman was seen today for discuss cardiac questions.  Diagnoses and associated orders for this visit:  Mitral regurgitation - 2D Echocardiogram without contrast; Future  CHF, chronic - 2D Echocardiogram without contrast; Future  Generalized anxiety disorder - FLUoxetine (PROZAC) 20 MG capsule; Take 3 capsules (60 mg total) by mouth daily. Taper: Two by mouth daily for one week then one by mouth daily for the last week.  Pain, dental -  traMADol (ULTRAM) 50 MG tablet; Take 2 tablets (100 mg total) by mouth every 8 (eight) hours as needed for moderate pain.  Headache(784.0) - cyclobenzaprine (FLEXERIL) 10 MG tablet; Take a half to a full tab every 8-12 hours only as needed for neck spasm, may cause sedation.  Other Orders - venlafaxine XR (EFFEXOR XR) 75 MG 24 hr capsule; One by mouth daily starting at end of prozac taper, then two by mouth every  morning.     headache: Stop Fioricet due to rebound headache, continue cyclobenzaprine, may use full dose tramadol for treatment of headaches, strongly recommended not to use any any opiates for headache control. (Please disregard dental pain diagnosis above) Generalized anxiety disorder with depression: Stable however if he is able to get the same benefit from Effexor this may provide him better headache prevention. Discussed a taper down of Prozac and start up of Effexor. CHF with mitral regurgitation: Discussed with patient that it would be in his best interest to go along with the recommendations set forth by the first and second opinion from his cardiologist. Patient is adamant about not having any invasive cardiac procedure even a TEE at this current time. We discussed that his mitral regurgitation will not repair itself on its own and will set him up for worsening congestive heart failure, the only way to correct this would be to go forth with surgery appears to be a candidate. Patient and daughter would like to know if there is any noninvasive tests they can perform to check his current heart function other than TEE or catheterization, they're specifically asking if they can have another TTE.  The patient states that if the efficiency of his heart has improved and he was still hold off on further intervention however if declining or unimproved from his hospitalization after diuresis he would strongly consider returning to see cardiology  40 minutes spent face-to-face during visit today of which at least 50% was counseling or coordinating care regarding: 1. Mitral regurgitation   2. CHF, chronic   3. Generalized anxiety disorder   4. Pain, dental   5. Headache(784.0)      Return in about 4 weeks (around 09/08/2013).

## 2013-08-17 ENCOUNTER — Telehealth: Payer: Self-pay | Admitting: Family Medicine

## 2013-08-17 ENCOUNTER — Telehealth: Payer: Self-pay | Admitting: *Deleted

## 2013-08-17 DIAGNOSIS — R519 Headache, unspecified: Secondary | ICD-10-CM | POA: Insufficient documentation

## 2013-08-17 DIAGNOSIS — R51 Headache: Secondary | ICD-10-CM

## 2013-08-17 MED ORDER — METAXALONE 800 MG PO TABS: ORAL_TABLET | ORAL | Status: DC

## 2013-08-17 NOTE — Telephone Encounter (Signed)
Pt notified rx faxed.

## 2013-08-17 NOTE — Telephone Encounter (Signed)
Maxwell Hoffman, Will you please let Maxwell Hoffman know that his insurance is no longer covering cyclobenzaprine.  We can send a similar alternate to his [facility] if he'd like (placed in your inbox).

## 2013-08-17 NOTE — Telephone Encounter (Signed)
LMOM informing pt not to go to [facility] for Echo because we are still waiting on Auth # and [facility] won't do echo w/o number.  Oscar La, LPN

## 2013-08-19 ENCOUNTER — Telehealth: Payer: Self-pay | Admitting: *Deleted

## 2013-08-19 NOTE — Telephone Encounter (Signed)
Echo approved. Auth # [id number].  Oscar La, LPN

## 2013-08-24 ENCOUNTER — Telehealth: Payer: Self-pay | Admitting: Family Medicine

## 2013-08-24 NOTE — Telephone Encounter (Signed)
Pt notified and states he will contact Dr. Shearon Stalls office

## 2013-08-24 NOTE — Telephone Encounter (Signed)
Maxwell Hoffman, Will you please let Maxwell Hoffman know that his echocardiogram from January 15th compared to that on October 24th is overall unchanged.  Ejection fraction remains at 45-50% and the mitral valve regurgitation is again classified as severe.   Based on Dr. Shearon Stalls recommendations I would still recommend he strongly consider working up his candidacy for a mitral valve replacement with Dr. Shearon Stalls group.

## 2013-09-03 ENCOUNTER — Telehealth: Payer: Self-pay | Admitting: Family Medicine

## 2013-09-03 DIAGNOSIS — C4491 Basal cell carcinoma of skin, unspecified: Secondary | ICD-10-CM

## 2013-09-03 NOTE — Telephone Encounter (Signed)
Patient called request a referral to [facility]. Thanks

## 2013-09-07 ENCOUNTER — Other Ambulatory Visit: Payer: Self-pay | Admitting: Family Medicine

## 2013-09-08 ENCOUNTER — Other Ambulatory Visit: Payer: Self-pay | Admitting: Family Medicine

## 2013-09-08 NOTE — Telephone Encounter (Signed)
Maxwell Hoffman, Can you clarify if this refill request is correct, I was informed by his insurance is no longer covering cyclobenzaprine and metaxalone was sent instead on Aug 17 2013

## 2013-09-09 ENCOUNTER — Telehealth: Payer: Self-pay | Admitting: Family Medicine

## 2013-09-09 MED ORDER — CLONAZEPAM 1 MG PO TABS: ORAL_TABLET | ORAL | Status: DC

## 2013-09-09 NOTE — Telephone Encounter (Signed)
rx faxed

## 2013-09-09 NOTE — Telephone Encounter (Signed)
Maxwell Hoffman, Rx placed in in-box ready for pickup/faxing.  

## 2013-09-10 ENCOUNTER — Telehealth: Payer: Self-pay | Admitting: *Deleted

## 2013-09-10 MED ORDER — CYCLOBENZAPRINE HCL 10 MG PO TABS: ORAL_TABLET | ORAL | Status: DC

## 2013-09-10 NOTE — Telephone Encounter (Signed)
Pt called and states [facility] sent a request to [place] for refill on the flexreril and has not heard anything.pt states he takes the flexeril along with the tramadol for his headaches. After looking at his med list it looks like skelaxin was rx'ed for HA's last because the insurance no longer covers flexeril.   called and spoke with pt's daughter. Insurance will not cover either skelaxin or flexeril. Out of the two flexeril is only $25 out of pocket for the patient. My question is do I need to change the directions to say take one tablet every 8 hours for headaches and how many are you ok with him getting per month?

## 2013-09-10 NOTE — Telephone Encounter (Signed)
Maxwell Hoffman, Please also refer to my response on 09/08/13 for the refill request regarding this issue. I'm assuming flexeril is the preferred muscle relaxer for his tension headaches therefore I'll send in a Rx ([employer] [facility] [employer]) for this with my recommended dosage.  Can you please update him or his family.

## 2013-09-11 NOTE — Telephone Encounter (Signed)
Left message on vm

## 2013-09-25 ENCOUNTER — Other Ambulatory Visit: Payer: Self-pay | Admitting: Family Medicine

## 2013-09-28 ENCOUNTER — Encounter: Payer: Self-pay | Admitting: Family Medicine

## 2013-10-30 ENCOUNTER — Other Ambulatory Visit: Payer: Self-pay | Admitting: Family Medicine

## 2013-11-12 ENCOUNTER — Other Ambulatory Visit: Payer: Self-pay | Admitting: Family Medicine

## 2013-11-25 ENCOUNTER — Telehealth: Payer: Self-pay | Admitting: *Deleted

## 2013-11-25 ENCOUNTER — Other Ambulatory Visit: Payer: Self-pay | Admitting: Family Medicine

## 2013-11-25 NOTE — Telephone Encounter (Signed)
closed

## 2013-12-03 ENCOUNTER — Other Ambulatory Visit: Payer: Self-pay | Admitting: Family Medicine

## 2013-12-17 ENCOUNTER — Other Ambulatory Visit: Payer: Self-pay | Admitting: Family Medicine

## 2013-12-25 ENCOUNTER — Ambulatory Visit (INDEPENDENT_AMBULATORY_CARE_PROVIDER_SITE_OTHER): Payer: Medicare HMO | Admitting: Family Medicine

## 2013-12-25 ENCOUNTER — Ambulatory Visit (INDEPENDENT_AMBULATORY_CARE_PROVIDER_SITE_OTHER): Payer: Medicare HMO

## 2013-12-25 ENCOUNTER — Encounter: Payer: Self-pay | Admitting: Family Medicine

## 2013-12-25 VITALS — BP 118/72 | HR 62 | Wt 265.0 lb

## 2013-12-25 DIAGNOSIS — I330 Acute and subacute infective endocarditis: Secondary | ICD-10-CM

## 2013-12-25 DIAGNOSIS — E785 Hyperlipidemia, unspecified: Secondary | ICD-10-CM

## 2013-12-25 DIAGNOSIS — J189 Pneumonia, unspecified organism: Secondary | ICD-10-CM

## 2013-12-25 DIAGNOSIS — I10 Essential (primary) hypertension: Secondary | ICD-10-CM

## 2013-12-25 DIAGNOSIS — Z951 Presence of aortocoronary bypass graft: Secondary | ICD-10-CM

## 2013-12-25 DIAGNOSIS — Z8701 Personal history of pneumonia (recurrent): Secondary | ICD-10-CM

## 2013-12-25 DIAGNOSIS — D7589 Other specified diseases of blood and blood-forming organs: Secondary | ICD-10-CM

## 2013-12-25 MED ORDER — LISINOPRIL 20 MG PO TABS: ORAL_TABLET | ORAL | Status: DC

## 2013-12-25 NOTE — Progress Notes (Signed)
CC: Maxwell Hoffman is a 68 y.o. 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 He is currently on daptomycin and gentamicin managed by [facility] infectious disease who is also watching his kidney function closely. He is getting these medications on a daily basis through a PICC line.  At time of discharge white count is normal, at infectious disease on the 18th his white count was normal as well with a normal hemoglobin.  He has a history of macrocytosis and B12 deficiency. He reports fatigue and his daughter would like to know whether or not the fatigue is due to the return of his B12 deficiency.  With respect to his pneumonia he denies any shortness of breath or cough since being discharged from the hospital.  He has blood pressure check daily during his antibiotic infusions. He tells me that on multiple visits he has had systolics in the 42P and he has some dizziness when standing when blood pressures this low. Continues on carvedilol, doxazosin, lisinopril.  Denies chest pain, shortness of breath, peripheral edema, motor sensory disturbances  He has a history of hyperlipidemia and during blood draws a nurse/phlebotomist noted that she believes she saw white particles in the test tubes.  She told him that this could be a sign of uncontrolled hyperlipidemia.    Review Of Systems Outlined In HPI  Past Medical History  Diagnosis Date  . Hypertension   . Hyperlipidemia   . Heart attack   . Diabetes   . CAD (coronary artery disease)   . COPD (chronic obstructive pulmonary disease)     Past Surgical History  Procedure Laterality Date  . Coronary artery bypass graft      x 3  . Tonsillectomy     Family History  Problem Relation Age of Onset  . Hypertension Father     History   Social History  . Marital  Status: Widowed    Spouse Name: N/A    Number of Children: 3  . Years of Education: N/A   Occupational History  .      Retired   Social History Main Topics  . Smoking status: Current Every Day Smoker    Types: Cigarettes  . Smokeless tobacco: Not on file  . Alcohol Use: Yes     Comment: 3 drinks per night  . Drug Use: Not on file  . Sexual Activity: Not Currently   Other Topics Concern  . Not on file   Social History Narrative  . No narrative on file     Objective: BP 118/72  Pulse 62  Wt 265 lb (120.203 kg)  General: Alert and Oriented, No Acute Distress HEENT: Pupils equal, round, reactive to light. Conjunctivae clear.  Moist membranes there is unremarkable Lungs: Clear to auscultation bilaterally, no wheezing/ronchi/rales.  Comfortable work of breathing. Good air movement. Cardiac: Regular rate and rhythm. Normal S1/S2.  Grade 3 holosystolic murmur heard best at the apex which is nonradiating, rubs, nor gallops.   Abdomen: Obese soft nontender Extremities: No peripheral edema.  Strong peripheral pulses.  Mental Status: No depression, anxiety, nor agitation. Skin: Warm and dry.  Assessment & Plan: Maxwell Hoffman was seen today for hospital f/u.  Diagnoses and associated orders for this visit:  Bacterial endocarditis  Macrocytosis - B12  CAP (community acquired pneumonia) - DG Chest 2 View; Future  Hyperlipidemia - Lipid  panel  Essential hypertension, benign  Other Orders - lisinopril (PRINIVIL,ZESTRIL) 20 MG tablet; TAKE 1/2 TABLET BY MOUTH DAILY    Bacterial endocarditis: Controlled I encouraged him to continue his last week of antibiotic infusions with the infectious disease department. He plans on getting his teeth pulled next week. Macrocytosis and history of B12 deficiency, checking B12 level today Community acquired pneumonia: Checking chest x-ray to ensure resolution of infiltrates Hyperlipidemia: It seems reasonable to check his cholesterol since it is  possible that lipid particles were seen in his gross blood Essential hypertension: Controlled however his regimen may be too aggressive therefore decreasing lisinopril to only 10 mg daily I've encouraged him to strongly consider getting his mitral valve replaced, he has followup with his cardiothoracic surgeons  40 minutes spent face-to-face during visit today of which at least 50% was counseling or coordinating care regarding: 1. Bacterial endocarditis   2. Macrocytosis   3. CAP (community acquired pneumonia)   4. Hyperlipidemia   5. Essential hypertension, benign      Return if symptoms worsen or fail to improve.

## 2013-12-26 LAB — LIPID PANEL
Cholesterol: 185 mg/dL (ref 0–200)
HDL: 35 mg/dL — AB (ref >39)
LDL CALC: 99 mg/dL (ref 0–99)
TRIGLYCERIDES: 254 mg/dL — AB (ref <150)
Total CHOL/HDL Ratio: 5.3 Ratio
VLDL: 51 mg/dL — AB (ref 0–40)

## 2013-12-26 LAB — VITAMIN B12: Vitamin B-12: 989 pg/mL — ABNORMAL HIGH (ref 211–911)

## 2013-12-29 ENCOUNTER — Telehealth: Payer: Self-pay | Admitting: Family Medicine

## 2013-12-29 NOTE — Telephone Encounter (Signed)
Refilling albuterol inh via [facility].

## 2013-12-30 ENCOUNTER — Other Ambulatory Visit: Payer: Self-pay | Admitting: Family Medicine

## 2014-01-14 ENCOUNTER — Other Ambulatory Visit: Payer: Self-pay | Admitting: Family Medicine

## 2014-01-26 ENCOUNTER — Encounter: Payer: Self-pay | Admitting: Family Medicine

## 2014-01-27 ENCOUNTER — Other Ambulatory Visit: Payer: Self-pay | Admitting: Family Medicine

## 2014-01-28 ENCOUNTER — Other Ambulatory Visit: Payer: Self-pay | Admitting: Family Medicine

## 2014-02-09 ENCOUNTER — Other Ambulatory Visit: Payer: Self-pay

## 2014-02-09 MED ORDER — CLONAZEPAM 1 MG PO TABS: 1.0000 mg | ORAL_TABLET | Freq: Every day | ORAL | Status: DC

## 2014-02-12 ENCOUNTER — Other Ambulatory Visit: Payer: Self-pay | Admitting: Family Medicine

## 2014-02-25 ENCOUNTER — Other Ambulatory Visit: Payer: Self-pay | Admitting: Family Medicine

## 2014-03-19 ENCOUNTER — Other Ambulatory Visit: Payer: Self-pay | Admitting: Family Medicine

## 2014-03-29 ENCOUNTER — Ambulatory Visit: Payer: Medicare HMO | Admitting: Family Medicine

## 2014-03-30 ENCOUNTER — Ambulatory Visit (INDEPENDENT_AMBULATORY_CARE_PROVIDER_SITE_OTHER): Payer: Medicare HMO | Admitting: Family Medicine

## 2014-03-30 ENCOUNTER — Encounter: Payer: Self-pay | Admitting: Family Medicine

## 2014-03-30 ENCOUNTER — Telehealth: Payer: Self-pay | Admitting: Family Medicine

## 2014-03-30 VITALS — BP 126/79 | HR 77 | Wt 262.0 lb

## 2014-03-30 DIAGNOSIS — Z23 Encounter for immunization: Secondary | ICD-10-CM

## 2014-03-30 DIAGNOSIS — F411 Generalized anxiety disorder: Secondary | ICD-10-CM

## 2014-03-30 DIAGNOSIS — G4733 Obstructive sleep apnea (adult) (pediatric): Secondary | ICD-10-CM

## 2014-03-30 DIAGNOSIS — E669 Obesity, unspecified: Secondary | ICD-10-CM

## 2014-03-30 DIAGNOSIS — Z9989 Dependence on other enabling machines and devices: Principal | ICD-10-CM

## 2014-03-30 DIAGNOSIS — G4730 Sleep apnea, unspecified: Secondary | ICD-10-CM

## 2014-03-30 DIAGNOSIS — E785 Hyperlipidemia, unspecified: Secondary | ICD-10-CM

## 2014-03-30 DIAGNOSIS — M7061 Trochanteric bursitis, right hip: Secondary | ICD-10-CM

## 2014-03-30 DIAGNOSIS — R5383 Other fatigue: Secondary | ICD-10-CM

## 2014-03-30 DIAGNOSIS — M76899 Other specified enthesopathies of unspecified lower limb, excluding foot: Secondary | ICD-10-CM

## 2014-03-30 DIAGNOSIS — G478 Other sleep disorders: Secondary | ICD-10-CM

## 2014-03-30 DIAGNOSIS — I5022 Chronic systolic (congestive) heart failure: Secondary | ICD-10-CM

## 2014-03-30 DIAGNOSIS — I509 Heart failure, unspecified: Secondary | ICD-10-CM

## 2014-03-30 MED ORDER — TRAMADOL HCL 50 MG PO TABS: ORAL_TABLET | ORAL | Status: DC

## 2014-03-30 MED ORDER — VENLAFAXINE HCL ER 150 MG PO CP24
150.0000 mg | ORAL_CAPSULE | Freq: Every day | ORAL | Status: DC
Start: 2014-03-30 — End: 2014-12-30

## 2014-03-30 MED ORDER — CLONAZEPAM 1 MG PO TABS: ORAL_TABLET | ORAL | Status: DC

## 2014-03-30 NOTE — Telephone Encounter (Signed)
Seth Bake, Can you please see if [facility] can help [place] with managing Mr. Schauf's cpap supplies for a dx of OSA.  He already has a machine but the mask and tubing is breaking.  We do not have a sleep study report, if needed can you ask patient where he had this done.

## 2014-03-30 NOTE — Progress Notes (Signed)
CC: Maxwell Hoffman is a 68 y.o. male is here for medication manaegement   Subjective: HPI:  Followup anxiety: Since I saw him last he tapered off of Prozac and begin taking Effexor. He's been taking 150 milligrams on a daily basis and states she's noticed a huge improvement with not being so anxious off of the day currently his anxiety levels described as mild in severity at its worst and improves with as needed Klonopin. There was a mixup with a recent prescription for his Effexor where he was only prescribed a single 75 mg capsule daily, fortunately he recognized this error and has continued on 150 mg daily. Denies any intolerance to the medication  Followup OSA: He continues to use CPAP however he's been having to modify his mask due to reduced elasticity and cracking and some of the plastic. He does not feel like he has a good seal for constant pressure has noticed that his sleep has not been as distorted as it was in the past.   Followup hyperlipidemia: He's been taking 1 g of fish oil twice a day along with 80 mg of atorvastatin. He denies any motor quadrant pain, chest pain, nor limb claudication. Denies myalgias.  Followup systolic CHF: He's been taking Lasix on a daily basis, over the past month his only negative take an extra dose 3 times when he started to have edema in his ankles. He denies any shortness of breath, abdominal bloating, orthopnea nor PND. He continues to take carvedilol and lisinopril.  Requesting refills on tramadol that he's been using for right hip pain localized to the lateral aspect of the hip it is nonradiating there is worse when lying on her right hip or when pressing on the greater trochanter region. Denies any pain with weightbearing or groin pain   Review Of Systems Outlined In HPI  Past Medical History  Diagnosis Date  . Hypertension   . Hyperlipidemia   . Heart attack   . Diabetes   . CAD (coronary artery disease)   . COPD (chronic obstructive pulmonary  disease)     Past Surgical History  Procedure Laterality Date  . Coronary artery bypass graft      x 3  . Tonsillectomy     Family History  Problem Relation Age of Onset  . Hypertension Father     History   Social History  . Marital Status: Widowed    Spouse Name: N/A    Number of Children: 3  . Years of Education: N/A   Occupational History  .      Retired   Social History Main Topics  . Smoking status: Current Every Day Smoker    Types: Cigarettes  . Smokeless tobacco: Not on file  . Alcohol Use: Yes     Comment: 3 drinks per night  . Drug Use: Not on file  . Sexual Activity: Not Currently   Other Topics Concern  . Not on file   Social History Narrative  . No narrative on file     Objective: BP 126/79  Pulse 77  Wt 262 lb (118.842 kg)  General: Alert and Oriented, No Acute Distress HEENT: Pupils equal, round, reactive to light. Conjunctivae clear.  Moist mucous membranes pharynx unremarkable she Lungs: Clear to auscultation bilaterally, no wheezing/ronchi/rales.  Comfortable work of breathing. Good air movement. Cardiac: Regular rate and rhythm. Normal S1/S2.  Grade 2/6 holosystolic murmur at the second left intercostal space. No gallops Abdomen: Obese and soft Extremities: No peripheral edema.  Strong peripheral pulses. The pain is reproduced with palpation of the right greater trochanter Mental Status: No depression, anxiety, nor agitation. Skin: Warm and dry.  Assessment & Plan: Maxwell Hoffman was seen today for medication manaegement.  Diagnoses and associated orders for this visit:  OSA on CPAP  Hyperlipidemia - Lipid panel  Need for prophylactic vaccination and inoculation against influenza  Generalized anxiety disorder  Chronic systolic congestive heart failure  Other Orders - venlafaxine XR (EFFEXOR-XR) 150 MG 24 hr capsule; Take 1 capsule (150 mg total) by mouth daily with breakfast. - clonazePAM (KLONOPIN) 1 MG tablet; TAKE 1 tab by mouth tid  prn anxiety - traMADol (ULTRAM) 50 MG tablet; TAKE 2 TABLETS BY MOUTH EVERY 8 HOURS AS NEEDED FOR MODERATE FOR PAIN PRN    Obstructive sleep apnea: Uncontrolled with faulty CPAP Supplies, we will get in touch with [facility] to see if they can replace his supplies, we do not have his original sleep study documented and we may need to get it from his former residence near [place].  Generalized anxiety disorder: Controlled continue Effexor at 150 mg daily and as needed Klonopin Hyperlipidemia: Due for repeat lipid panel Systolic CHF: Controlled continue beta blocker, ACE inhibitor, Lasix Right greater trochanteric bursitis: He was given a handout outlining exercises and stretches to do at home for the next 2-3 weeks, continue tramadol as needed for pain  40 minutes spent face-to-face during visit today of which at least 50% was counseling or coordinating care regarding: 1. OSA on CPAP   2. Hyperlipidemia   3. Need for prophylactic vaccination and inoculation against influenza   4. Generalized anxiety disorder   5. Chronic systolic congestive heart failure   6. Trochanteric bursitis of right hip       Return in about 3 months (around 06/30/2014) for CHF F/U.

## 2014-03-31 ENCOUNTER — Other Ambulatory Visit: Payer: Self-pay

## 2014-03-31 MED ORDER — ATORVASTATIN CALCIUM 80 MG PO TABS: 80.0000 mg | ORAL_TABLET | Freq: Every day | ORAL | Status: DC

## 2014-03-31 NOTE — Telephone Encounter (Signed)
Refilled Lipitor

## 2014-04-01 LAB — LIPID PANEL
CHOLESTEROL: 158 mg/dL (ref 0–200)
HDL: 40 mg/dL (ref >39)
LDL Cholesterol: 88 mg/dL (ref 0–99)
TRIGLYCERIDES: 151 mg/dL — AB (ref <150)
Total CHOL/HDL Ratio: 4 Ratio
VLDL: 30 mg/dL (ref 0–40)

## 2014-04-01 NOTE — Telephone Encounter (Signed)
Pt had last sleep study in [place] and per him everything was prob destroyed during [place]. He doesn't remember the name of the place anyway so he will prob need a new sleep study

## 2014-04-02 NOTE — Telephone Encounter (Signed)
Left message on vm with results  

## 2014-04-02 NOTE — Telephone Encounter (Signed)
Sleep study referral has been placed. Please let me know if not contacted within the next week.

## 2014-04-25 ENCOUNTER — Other Ambulatory Visit: Payer: Self-pay | Admitting: Family Medicine

## 2014-04-26 ENCOUNTER — Ambulatory Visit (INDEPENDENT_AMBULATORY_CARE_PROVIDER_SITE_OTHER): Payer: Medicare HMO | Admitting: Family Medicine

## 2014-04-26 ENCOUNTER — Encounter: Payer: Self-pay | Admitting: Family Medicine

## 2014-04-26 VITALS — BP 131/82 | HR 74 | Wt 263.0 lb

## 2014-04-26 DIAGNOSIS — H6120 Impacted cerumen, unspecified ear: Secondary | ICD-10-CM

## 2014-04-26 DIAGNOSIS — E785 Hyperlipidemia, unspecified: Secondary | ICD-10-CM

## 2014-04-26 DIAGNOSIS — E119 Type 2 diabetes mellitus without complications: Secondary | ICD-10-CM

## 2014-04-26 DIAGNOSIS — R0602 Shortness of breath: Secondary | ICD-10-CM

## 2014-04-26 DIAGNOSIS — H6122 Impacted cerumen, left ear: Secondary | ICD-10-CM

## 2014-04-26 DIAGNOSIS — L570 Actinic keratosis: Secondary | ICD-10-CM

## 2014-04-26 NOTE — Progress Notes (Signed)
CC: Maxwell Hoffman is a 68 y.o. male is here for Extremity Weakness   Subjective: HPI:  Complains of shortness of breath with extremity weakness that has been going on for the past week. It occurs only when he is up walking around. Symptoms are described as mild in severity. Weakness is localized to the entire left and right leg. Symptoms seem to be worse after days that he is exerting himself with a new walking regimen. He admits that over the past 2 years he has had no exercise routine and live day sedentary lifestyle and over the past week has begun a walking regimen around his neighborhood.  Denies peripheral edema, orthopnea, PND, nor unintentional weight gain. He continues to take Lasix on a daily basis with an additional dose every other day.  Denies cough, wheezing, chest pain, nor limb claudication  Followup hyperlipidemia: Continues to take Lipitor on a daily basis without side effects and lower quadrant pain nor myalgias.  He started to cut out fat in his diet and would like to know if he can have his cholesterol checked today  Followup type 2 diabetes: Continues on metformin daily without side effects nor diarrhea. No outside blood sugars to report. Denies polyuria polyphasia or polydipsia  He has a rash on his chest is been present for the past 3 weeks. Is described as flaky, itchy, annoying, moderate in severity. No interventions as of yet.  He denies skin changes elsewhere.  Complains of decreased hearing in the left ear has been present for the past week. Mild in severity. Nothing particularly makes better or worse. He's tried to clear the ear with a Q-tip no other interventions. Denies any other motor or sensory disturbances he would like to take a look his ears.   Review Of Systems Outlined In HPI  Past Medical History  Diagnosis Date  . Hypertension   . Hyperlipidemia   . Heart attack   . Diabetes   . CAD (coronary artery disease)   . COPD (chronic obstructive pulmonary  disease)     Past Surgical History  Procedure Laterality Date  . Coronary artery bypass graft      x 3  . Tonsillectomy     Family History  Problem Relation Age of Onset  . Hypertension Father     History   Social History  . Marital Status: Widowed    Spouse Name: N/A    Number of Children: 3  . Years of Education: N/A   Occupational History  .      Retired   Social History Main Topics  . Smoking status: Current Every Day Smoker    Types: Cigarettes  . Smokeless tobacco: Not on file  . Alcohol Use: Yes     Comment: 3 drinks per night  . Drug Use: Not on file  . Sexual Activity: Not Currently   Other Topics Concern  . Not on file   Social History Narrative  . No narrative on file     Objective: BP 131/82  Pulse 74  Wt 263 lb (119.296 kg)  General: Alert and Oriented, No Acute Distress HEENT: Pupils equal, round, reactive to light. Conjunctivae clear.  External ears unremarkable, left ear canal has a cerumen impaction abutting the tympanic membrane, left ear canal is clear other than a small piece of wax at the 6:00 position of his tympanic membranes, middle ear appears open. Pink inferior turbinates.  Moist mucous membranes, pharynx without inflammation nor lesions.  Neck supple without palpable  lymphadenopathy nor abnormal masses. Lungs: Clear to auscultation bilaterally, no wheezing/ronchi/rales.  Comfortable work of breathing. Good air movement. Cardiac: Regular rate and rhythm. Grade 3/6 holosystolic murmur heard best at the left fifth/sixth intercostal space. Abdomen: Obese and soft Extremities: No peripheral edema.  Strong peripheral pulses. Full range of motion strength in both lower extremities Mental Status: No depression, anxiety, nor agitation. Skin: Warm and dry. 2 cm diameter region of actinic keratosis on the chest just above the sternal  Assessment & Plan: Moody was seen today for extremity weakness.  Diagnoses and associated orders for this  visit:  Type 2 diabetes mellitus without complication - Hemoglobin A1c  Hyperlipidemia - Lipid panel  SOB (shortness of breath) - CBC  Actinic keratosis  Cerumen impaction, left     Type 2 diabetes: Continue metformin pending A1c that is due today Hyperlipidemia: Continue on Lipitor and fish oil pending today's lipid panel Shortness of breath: Discussed the patient has a combination of relative deconditioning from a sedentary lifestyle and beginning a new exercise routine along with mitral regurgitation and possibly anemia therefore checking hemoglobin Cerumen impaction: Discuss using hydrogen peroxide on a daily basis the next week to help dissolve ear wax, will not removed today in the office given how deep in the canal the impaction is likely adherent to the tympanic membrane Actinic keratosis: Destructed today by cryotherapy    No Follow-up on file.  Cryotherapy Procedure Note  Pre-operative Diagnosis: Actinic keratosis  Post-operative Diagnosis: Actinic keratosis  Locations:upper central chest  Indications: pre-malignant  Anesthesia: none  Procedure Details  History of allergy to iodine: no. Pacemaker? no.  Patient informed of risks (permanent scarring, infection, light or dark discoloration, bleeding, infection, weakness, numbness and recurrence of the lesion) and benefits of the procedure and verbal informed consent obtained.  The areas are treated with liquid nitrogen therapy, frozen until ice ball extended 3 mm beyond lesion, allowed to thaw, and treated again. The patient tolerated procedure well.  The patient was instructed on post-op care, warned that there may be blister formation, redness and pain. Recommend OTC analgesia as needed for pain.  Condition: Stable  Complications: none.  Plan: 1. Instructed to keep the area dry and covered for 24-48h and clean thereafter. 2. Warning signs of infection were reviewed.   3. Recommended that the patient  use OTC analgesics as needed for pain.  4. Return in 1 weeks only if needed.

## 2014-04-27 LAB — CBC
HEMATOCRIT: 43.7 % (ref 39.0–52.0)
HEMOGLOBIN: 15.4 g/dL (ref 13.0–17.0)
MCH: 32.6 pg (ref 26.0–34.0)
MCHC: 35.2 g/dL (ref 30.0–36.0)
MCV: 92.4 fL (ref 78.0–100.0)
Platelets: 166 10*3/uL (ref 150–400)
RBC: 4.73 MIL/uL (ref 4.22–5.81)
RDW: 14.3 % (ref 11.5–15.5)
WBC: 6.8 10*3/uL (ref 4.0–10.5)

## 2014-04-27 LAB — LIPID PANEL
CHOL/HDL RATIO: 4.3 ratio
Cholesterol: 162 mg/dL (ref 0–200)
HDL: 38 mg/dL — AB (ref >39)
LDL CALC: 94 mg/dL (ref 0–99)
Triglycerides: 151 mg/dL — ABNORMAL HIGH (ref <150)
VLDL: 30 mg/dL (ref 0–40)

## 2014-04-27 LAB — HEMOGLOBIN A1C
Hgb A1c MFr Bld: 6.6 % — ABNORMAL HIGH (ref <5.7)
Mean Plasma Glucose: 143 mg/dL — ABNORMAL HIGH (ref <117)

## 2014-05-22 ENCOUNTER — Other Ambulatory Visit: Payer: Self-pay | Admitting: Family Medicine

## 2014-05-23 ENCOUNTER — Other Ambulatory Visit: Payer: Self-pay | Admitting: Family Medicine

## 2014-06-25 ENCOUNTER — Other Ambulatory Visit: Payer: Self-pay | Admitting: Family Medicine

## 2014-06-25 ENCOUNTER — Encounter: Payer: Self-pay | Admitting: Family Medicine

## 2014-06-25 ENCOUNTER — Ambulatory Visit (INDEPENDENT_AMBULATORY_CARE_PROVIDER_SITE_OTHER): Payer: Medicare HMO | Admitting: Family Medicine

## 2014-06-25 VITALS — BP 125/80 | HR 67 | Temp 97.7°F | Wt 268.0 lb

## 2014-06-25 DIAGNOSIS — F411 Generalized anxiety disorder: Secondary | ICD-10-CM

## 2014-06-25 DIAGNOSIS — J449 Chronic obstructive pulmonary disease, unspecified: Secondary | ICD-10-CM

## 2014-06-25 DIAGNOSIS — J440 Chronic obstructive pulmonary disease with acute lower respiratory infection: Secondary | ICD-10-CM

## 2014-06-25 MED ORDER — HYDROCODONE-HOMATROPINE 5-1.5 MG/5ML PO SYRP: 5.0000 mL | ORAL_SOLUTION | Freq: Four times a day (QID) | ORAL | Status: DC | PRN

## 2014-06-25 MED ORDER — CLONAZEPAM 1 MG PO TABS: ORAL_TABLET | ORAL | Status: DC

## 2014-06-25 MED ORDER — PREDNISONE 20 MG PO TABS: ORAL_TABLET | ORAL | Status: AC

## 2014-06-25 MED ORDER — DOXYCYCLINE HYCLATE 100 MG PO TABS: 100.0000 mg | ORAL_TABLET | Freq: Two times a day (BID) | ORAL | Status: DC

## 2014-06-25 MED ORDER — FLUCONAZOLE 150 MG PO TABS: 150.0000 mg | ORAL_TABLET | Freq: Once | ORAL | Status: DC

## 2014-06-25 NOTE — Progress Notes (Signed)
CC: Maxwell Hoffman is a 68 y.o. male is here for chest congestion   Subjective: HPI:  Productive cough that has been present for a little over a week now worsening on a daily basis. Currently moderate to severe in severity and worse in the evening but present all hours of the day. Symptoms are greatly improved when using DuoNeb at home. But only temporarily for an hour or 2. Symptoms are greatly interfering with quality of sleep causing frequent awakening. [place] by mild shortness of breath from baseline with exertion. Denies fevers, chills, confusion, chest pain. Review of systems positive for nasal congestion and ear fullness bilaterally.  Requesting refills on clonazepam. He takes this 3 times a day for anxiety. It still provides him with 100% alleviation of anxiety for up to 8 hours. Symptoms have not been worsening since onset years ago.   Review Of Systems Outlined In HPI  Past Medical History  Diagnosis Date  . Hypertension   . Hyperlipidemia   . Heart attack   . Diabetes   . CAD (coronary artery disease)   . COPD (chronic obstructive pulmonary disease)     Past Surgical History  Procedure Laterality Date  . Coronary artery bypass graft      x 3  . Tonsillectomy     Family History  Problem Relation Age of Onset  . Hypertension Father     History   Social History  . Marital Status: Widowed    Spouse Name: N/A    Number of Children: 3  . Years of Education: N/A   Occupational History  .      Retired   Social History Main Topics  . Smoking status: Current Every Day Smoker    Types: Cigarettes  . Smokeless tobacco: Not on file  . Alcohol Use: Yes     Comment: 3 drinks per night  . Drug Use: Not on file  . Sexual Activity: Not Currently   Other Topics Concern  . Not on file   Social History Narrative     Objective: BP 125/80 mmHg  Pulse 67  Temp(Src) 97.7 F (36.5 C) (Oral)  Wt 268 lb (121.564 kg)  SpO2 96%  General: Alert and Oriented, No Acute  Distress HEENT: Pupils equal, round, reactive to light. Conjunctivae clear.  External ears unremarkable, canals clear with intact TMs with appropriate landmarks.  Middle ear appears open without effusion. Boggy erythematous inferior turbinates.  Moist mucous membranes, pharynx without inflammation nor lesions.  Neck supple without palpable lymphadenopathy nor abnormal masses. Lungs: Frequent coughing. Trace and expert or wheezing centrally, no rhonchi or rales. No signs of consolidation Cardiac: Regular rate and rhythm. Normal S1/S2.  Grade 3/6 holosystolic murmur left second intercostal space Extremities: No peripheral edema.  Strong peripheral pulses.  Mental Status: No depression, anxiety, nor agitation. Skin: Warm and dry.  Assessment & Plan: Maxwell Hoffman was seen today for chest congestion.  Diagnoses and associated orders for this visit:  Bronchitis, chronic obstructive w acute bronchitis - doxycycline (VIBRA-TABS) 100 MG tablet; Take 1 tablet (100 mg total) by mouth 2 (two) times daily. - predniSONE (DELTASONE) 20 MG tablet; Three tabs daily days 1-3, two tabs daily days 4-6, one tab daily days 7-9, half tab daily days 10-13. - fluconazole (DIFLUCAN) 150 MG tablet; Take 1 tablet (150 mg total) by mouth once. May repeat every 3 days if needed.  COPD, moderate  Generalized anxiety disorder  Other Orders - clonazePAM (KLONOPIN) 1 MG tablet; TAKE 1 tab by mouth  tid prn anxiety - HYDROcodone-homatropine (HYCODAN) 5-1.5 MG/5ML syrup; Take 5 mLs by mouth every 6 (six) hours as needed for cough.    Acute bronchitis in the setting of known COPD. Continue duo nebs scheduled and every 4 hours for the next 48 hours. Begin prednisone taper and doxycycline. He has a history of yeast infections on antibiotics therefore fluconazole was provided. Hycodan to help with sleep Generalized anxiety disorder: Controlled on clonazepam.   Return if symptoms worsen or fail to improve.

## 2014-06-28 ENCOUNTER — Other Ambulatory Visit: Payer: Self-pay | Admitting: Family Medicine

## 2014-07-16 ENCOUNTER — Other Ambulatory Visit: Payer: Self-pay | Admitting: *Deleted

## 2014-07-16 MED ORDER — LISINOPRIL 20 MG PO TABS: 20.0000 mg | ORAL_TABLET | Freq: Every day | ORAL | Status: DC

## 2014-07-16 NOTE — Telephone Encounter (Signed)
Message left that refill needed for lisinopril. This was sent to pharmacy. Margette Fast, CMA

## 2014-07-20 IMAGING — CR DG SHOULDER 2+V*R*
3 series · 3 of 3 positions shown · non-contrast
Comparison: None.

CLINICAL DATA: Right shoulder pain, no recent injury, history of
prior fracture 2 years ago

EXAM:
RIGHT SHOULDER - 2+ VIEW

[view not recorded (1 of 3)]
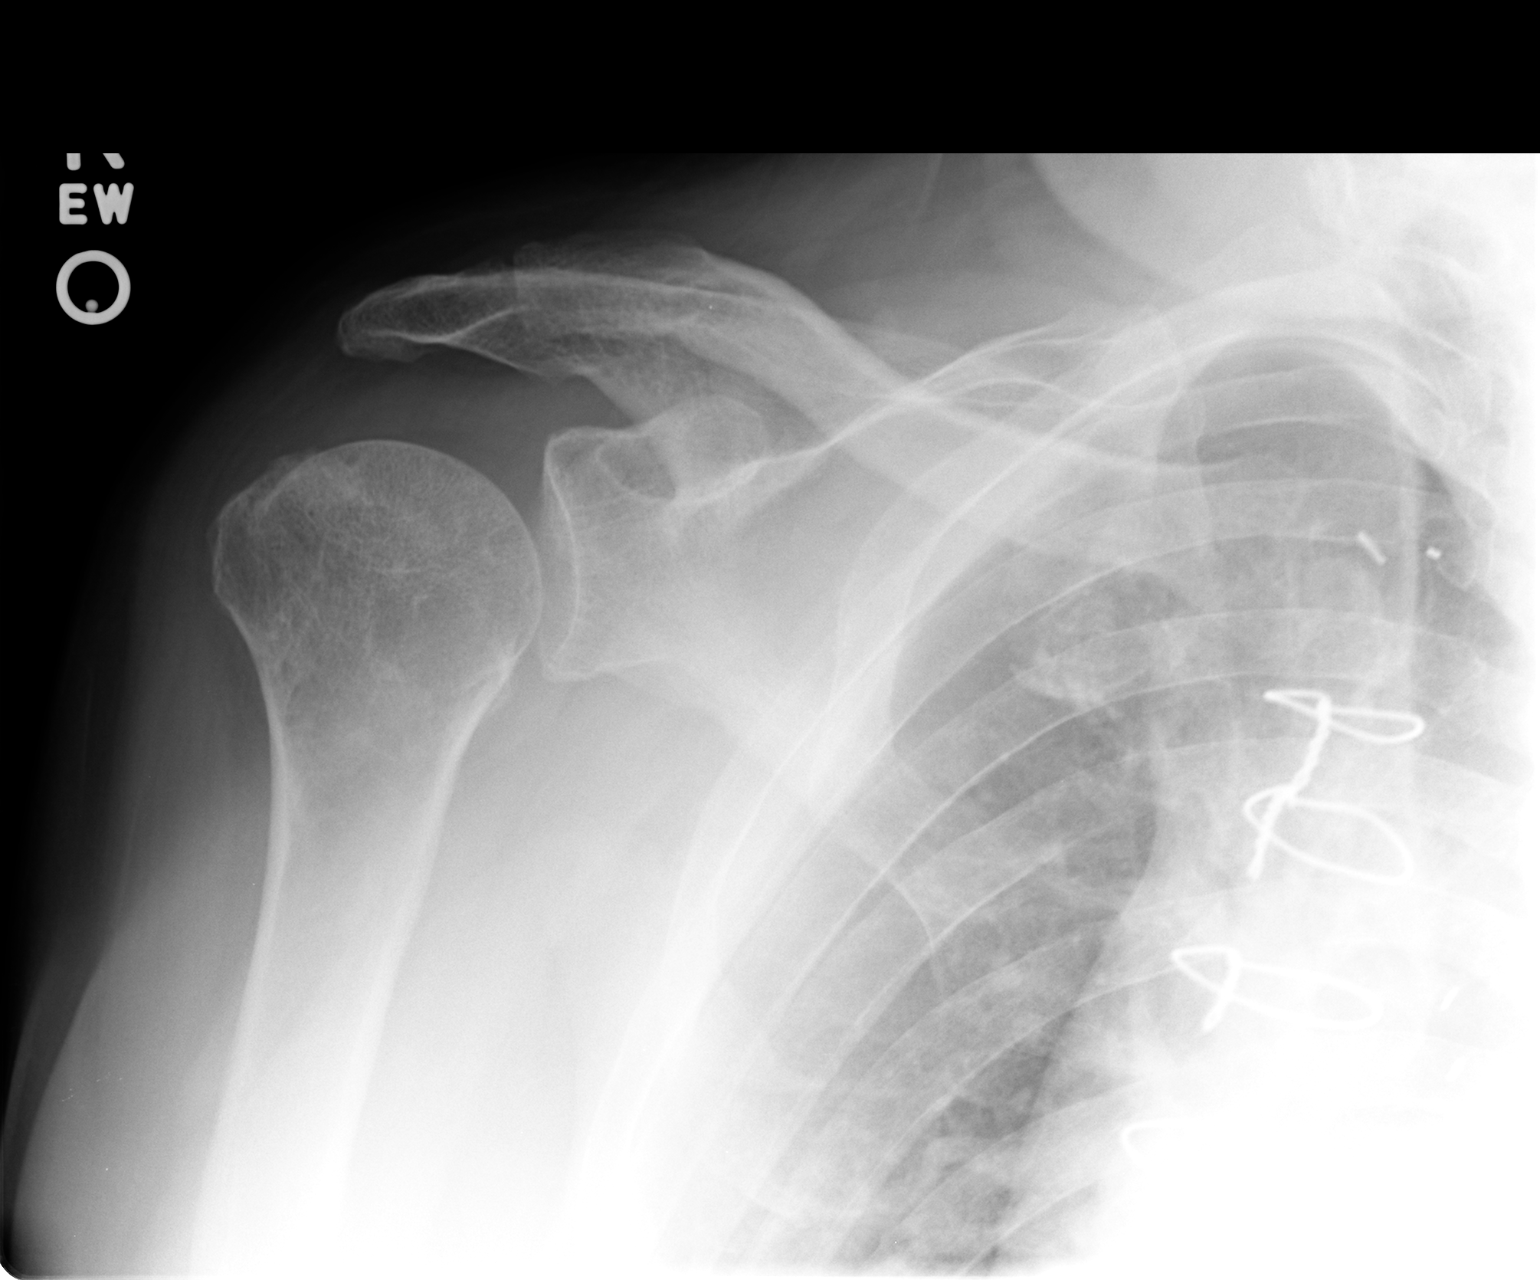

[view not recorded (2 of 3)]
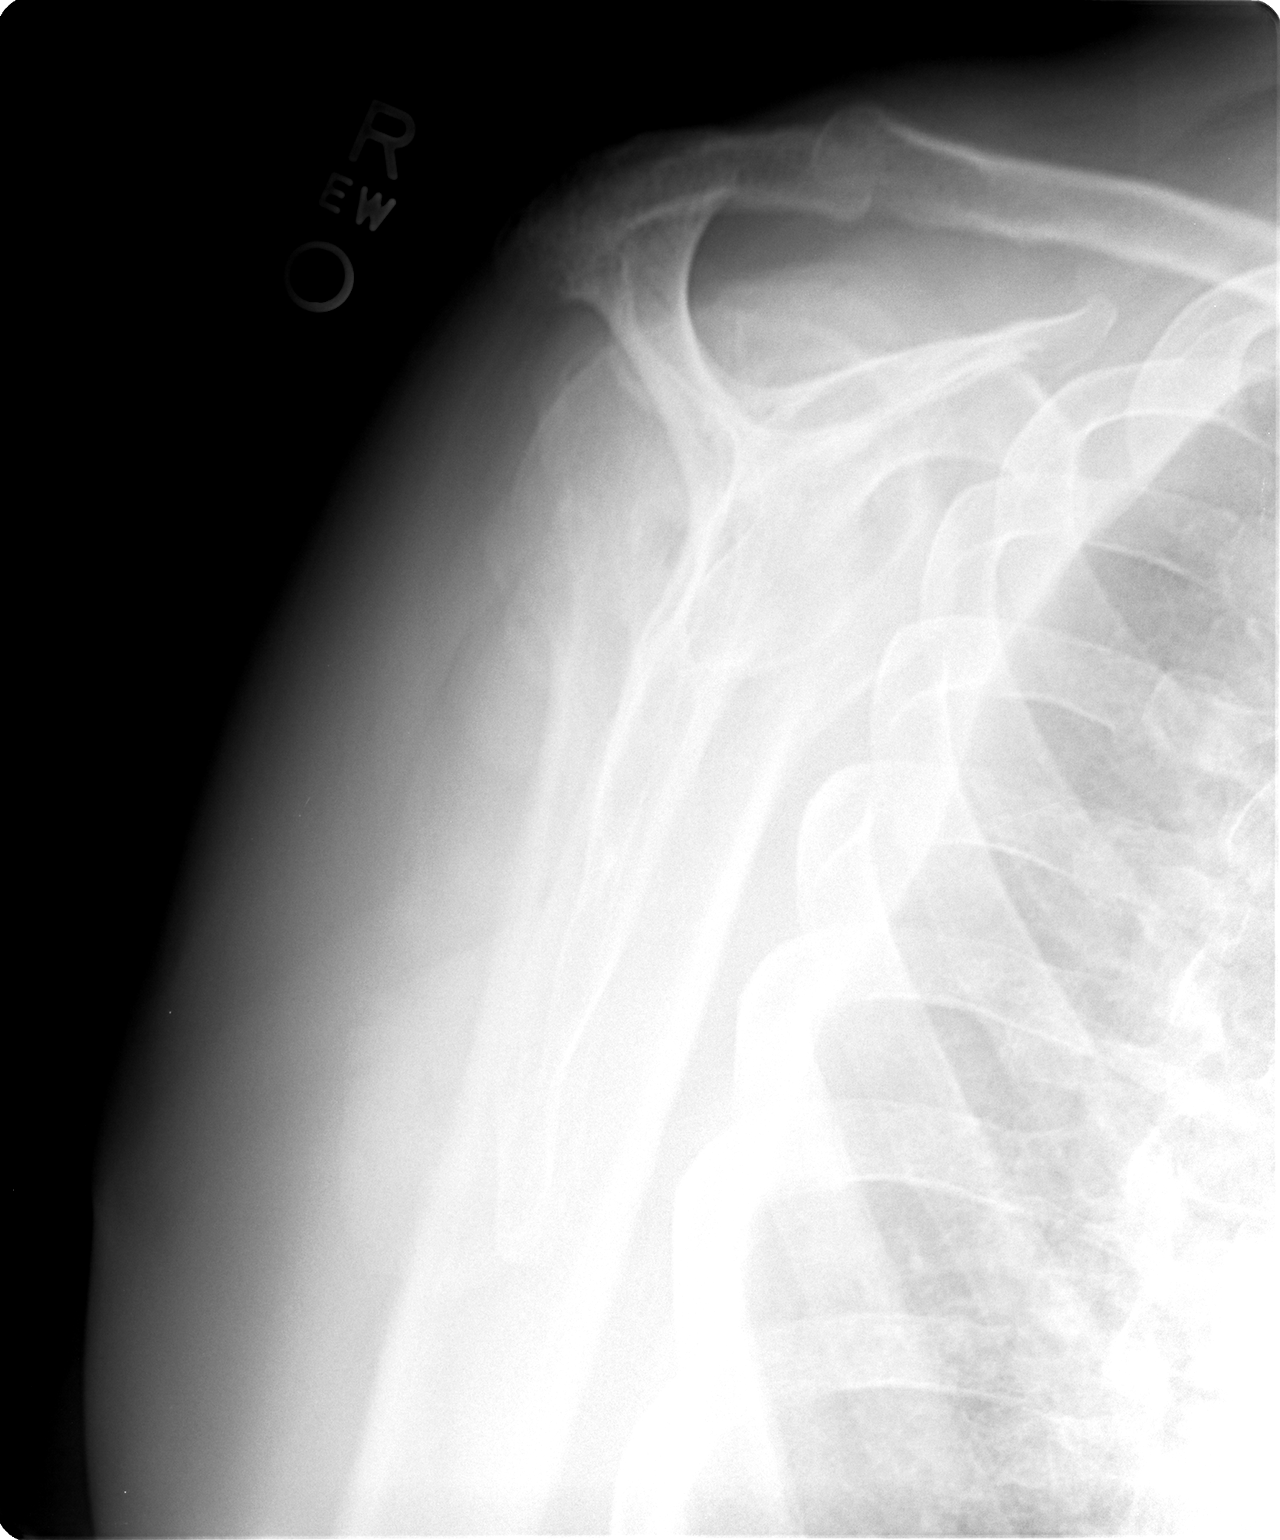

[view not recorded (3 of 3)]
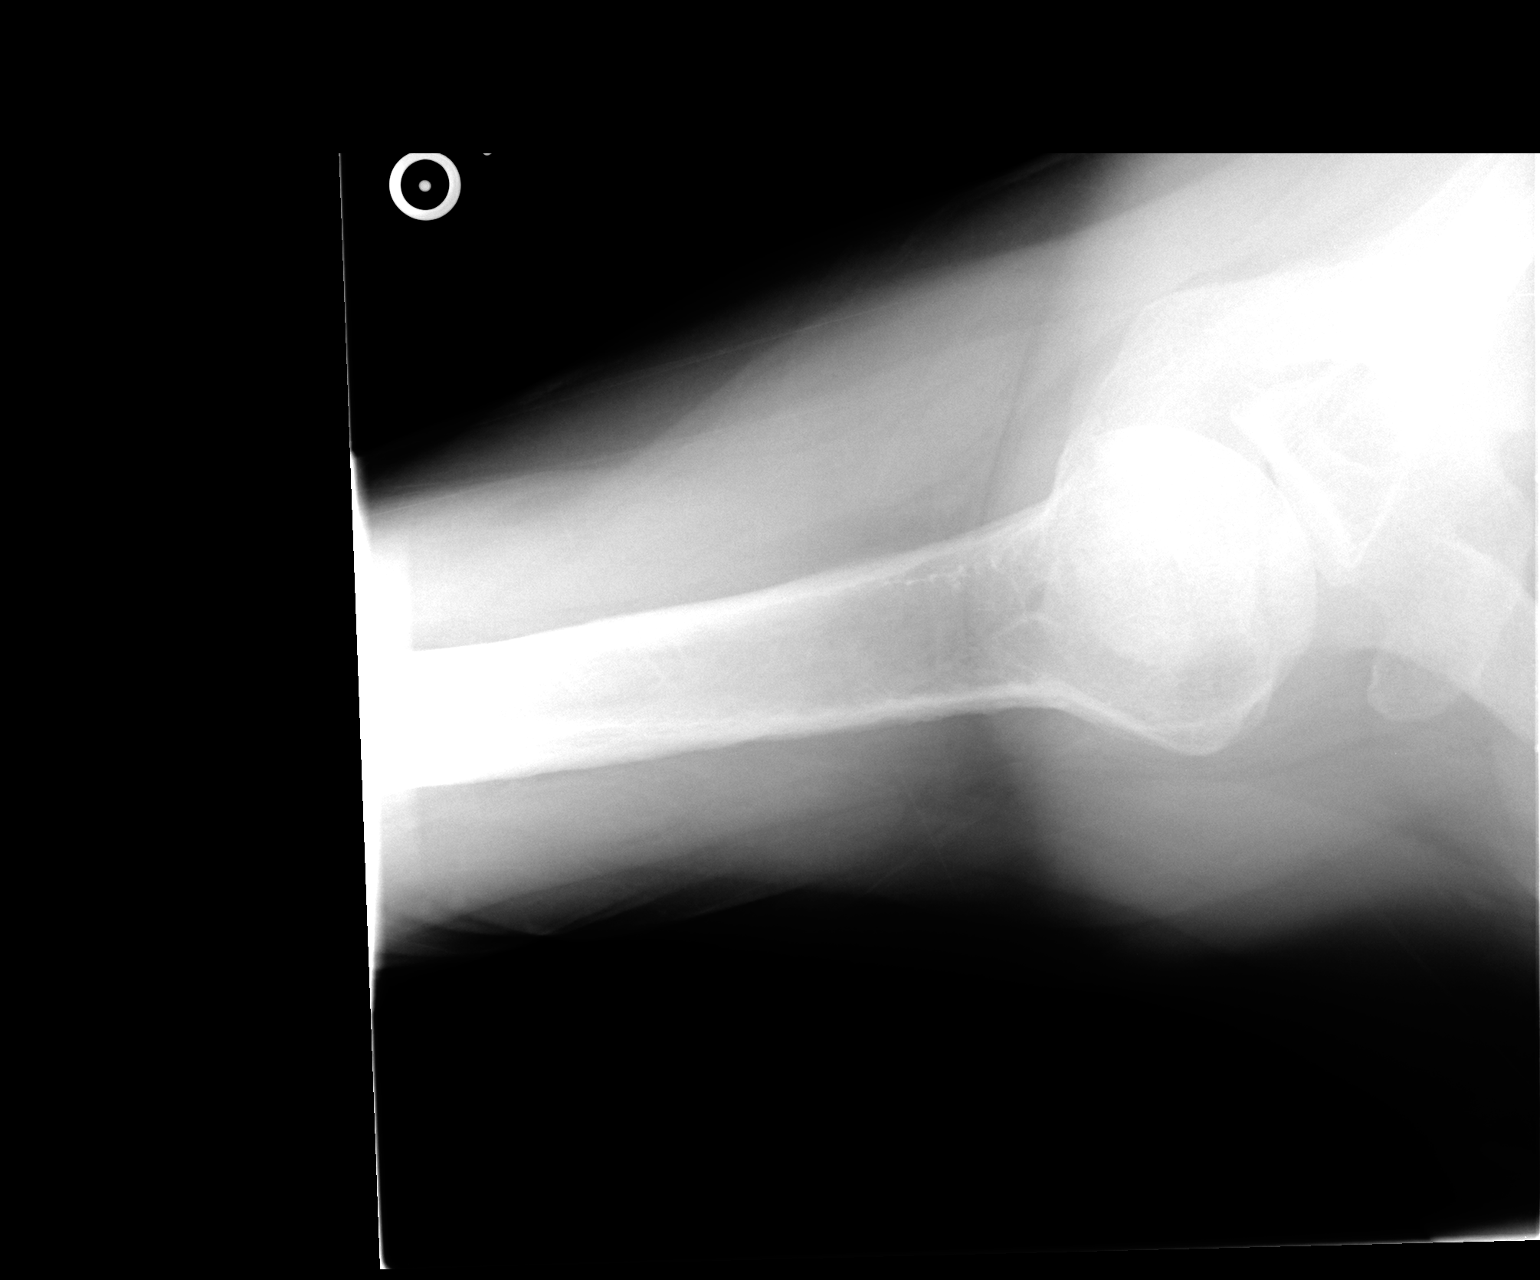

[3 of 3 positions shown; findings below may reference images not displayed]

FINDINGS: The right humeral head is in normal position and the glenohumeral
joint space appears well preserved. The right AC joint is normally
aligned. No acute abnormality is seen.
IMPRESSION: Negative.

## 2014-07-26 ENCOUNTER — Other Ambulatory Visit: Payer: Self-pay | Admitting: Family Medicine

## 2014-07-28 ENCOUNTER — Other Ambulatory Visit: Payer: Self-pay | Admitting: Family Medicine

## 2014-08-08 ENCOUNTER — Other Ambulatory Visit: Payer: Self-pay | Admitting: Family Medicine

## 2014-08-23 ENCOUNTER — Other Ambulatory Visit: Payer: Self-pay | Admitting: Family Medicine

## 2014-09-02 ENCOUNTER — Telehealth: Payer: Self-pay | Admitting: Family Medicine

## 2014-09-02 MED ORDER — CLONAZEPAM 1 MG PO TABS: ORAL_TABLET | ORAL | Status: DC

## 2014-09-02 NOTE — Telephone Encounter (Signed)
Refill req 

## 2014-09-14 ENCOUNTER — Ambulatory Visit (INDEPENDENT_AMBULATORY_CARE_PROVIDER_SITE_OTHER): Payer: Medicare HMO | Admitting: Family Medicine

## 2014-09-14 ENCOUNTER — Encounter: Payer: Self-pay | Admitting: Family Medicine

## 2014-09-14 VITALS — BP 126/74 | HR 82 | Wt 265.0 lb

## 2014-09-14 DIAGNOSIS — I5022 Chronic systolic (congestive) heart failure: Secondary | ICD-10-CM

## 2014-09-14 DIAGNOSIS — E119 Type 2 diabetes mellitus without complications: Secondary | ICD-10-CM

## 2014-09-14 DIAGNOSIS — J449 Chronic obstructive pulmonary disease, unspecified: Secondary | ICD-10-CM

## 2014-09-14 DIAGNOSIS — N400 Enlarged prostate without lower urinary tract symptoms: Secondary | ICD-10-CM | POA: Diagnosis not present

## 2014-09-14 LAB — POCT GLYCOSYLATED HEMOGLOBIN (HGB A1C): Hemoglobin A1C: 6.6

## 2014-09-14 MED ORDER — CYCLOBENZAPRINE HCL 10 MG PO TABS: ORAL_TABLET | ORAL | Status: DC

## 2014-09-14 MED ORDER — POTASSIUM CHLORIDE ER 10 MEQ PO CPCR: 10.0000 meq | ORAL_CAPSULE | Freq: Every day | ORAL | Status: DC

## 2014-09-14 MED ORDER — TAMSULOSIN HCL 0.4 MG PO CAPS: 0.4000 mg | ORAL_CAPSULE | Freq: Every day | ORAL | Status: DC

## 2014-09-14 MED ORDER — LISINOPRIL 20 MG PO TABS: ORAL_TABLET | ORAL | Status: DC

## 2014-09-14 NOTE — Progress Notes (Signed)
CC: Maxwell Hoffman is a 69 y.o. male is here for Diabetes   Subjective: HPI:  Follow up type 2 diabetes continues to take metformin on a daily basis without any known side effects. Denies polyuria polyphagia or polydipsia. He admits to overindulging with Christmas candies and cookies during December but is back on watching what he eats. No outside blood sugars to report.  Follow-up CHF: Continues to take Lasix 20 mg only once a day however during the holidays if he increased his sodium intake he would begin regaining fluid in both ankles and would take an extra Lasix in the evening. He denies orthopnea nor peripheral edema other than that described above. He's on beta blocker and a ACE inhibitor. Denies exertional chest pain  Follow-up COPD: He has not required albuterol since I saw him last. He denies cough shortness of breath or wheezing.  Follow-up BPH: Continues to take Flomax on a daily basis awakening at no more than once a night to urinate. Denies urinary urgency hesitancy or dysuria   Review Of Systems Outlined In HPI  Past Medical History  Diagnosis Date  . Hypertension   . Hyperlipidemia   . Heart attack   . Diabetes   . CAD (coronary artery disease)   . COPD (chronic obstructive pulmonary disease)     Past Surgical History  Procedure Laterality Date  . Coronary artery bypass graft      x 3  . Tonsillectomy     Family History  Problem Relation Age of Onset  . Hypertension Father     History   Social History  . Marital Status: Widowed    Spouse Name: N/A    Number of Children: 3  . Years of Education: N/A   Occupational History  .      Retired   Social History Main Topics  . Smoking status: Current Every Day Smoker    Types: Cigarettes  . Smokeless tobacco: Not on file  . Alcohol Use: Yes     Comment: 3 drinks per night  . Drug Use: Not on file  . Sexual Activity: Not Currently   Other Topics Concern  . Not on file   Social History Narrative      Objective: BP 126/74 mmHg  Pulse 82  Wt 265 lb (120.203 kg)  General: Alert and Oriented, No Acute Distress HEENT: Pupils equal, round, reactive to light. Conjunctivae clear.  Moist mucous membranes Lungs: Clear to auscultation bilaterally, no wheezing/ronchi/rales.  Comfortable work of breathing. Good air movement. Cardiac: Regular rate and rhythm. Normal S1/S2.  Grade 3 over 4 holosystolic murmur, no, rubs, nor gallops.   Abdomen: Obese and soft Extremities: No peripheral edema.  Strong peripheral pulses.  Mental Status: No depression, anxiety, nor agitation. Skin: Warm and dry.  Assessment & Plan: Maxwell Hoffman was seen today for diabetes.  Diagnoses and associated orders for this visit:  BPH (benign prostatic hyperplasia)  Chronic systolic congestive heart failure  COPD, moderate  Type 2 diabetes mellitus without complication - POCT HgB Y6R  Other Orders - lisinopril (PRINIVIL,ZESTRIL) 20 MG tablet; Take 1 tablet by mouth daily - potassium chloride (MICRO-K) 10 MEQ CR capsule; Take 1 capsule (10 mEq total) by mouth daily. - tamsulosin (FLOMAX) 0.4 MG CAPS capsule; Take 1 capsule (0.4 mg total) by mouth daily. - cyclobenzaprine (FLEXERIL) 10 MG tablet; TAKE 1/2 TO 1 TABLET BY MOUTH EVERY 8 TO 12 HOURS AS NEEDED FOR NECK SPASM OR HEADACHE. MAY CAUSE SEDATION    BPH:  Controlled continue Flomax refills provided CHF: Controlled continue Coreg, lisinopril, aspirin and daily Lasix. Okay to take an extra Lasix in the afternoon if retaining fluid Type 2 diabetes: A1c of 6.6, controlled continue metformin COPD: Controlled encouraged to quit smoking   Return in about 3 months (around 12/13/2014).

## 2014-09-15 ENCOUNTER — Other Ambulatory Visit: Payer: Self-pay | Admitting: Family Medicine

## 2014-09-16 ENCOUNTER — Other Ambulatory Visit: Payer: Self-pay | Admitting: *Deleted

## 2014-09-16 MED ORDER — METFORMIN HCL 500 MG PO TABS: ORAL_TABLET | ORAL | Status: DC

## 2014-10-11 ENCOUNTER — Telehealth: Payer: Self-pay | Admitting: Family Medicine

## 2014-10-11 MED ORDER — DOXAZOSIN MESYLATE 4 MG PO TABS: ORAL_TABLET | ORAL | Status: DC

## 2014-10-11 NOTE — Telephone Encounter (Signed)
Refill req 

## 2014-10-13 NOTE — Telephone Encounter (Signed)
Sent [facility] for Doxazosin 4 mg  Through cover my meds waiting on auth. - CF

## 2014-10-14 NOTE — Telephone Encounter (Signed)
Received fax approving Doxazosin 4mg . Auth is good until 10/13/2016 - CF

## 2014-10-26 ENCOUNTER — Other Ambulatory Visit: Payer: Self-pay | Admitting: Family Medicine

## 2014-10-28 ENCOUNTER — Telehealth: Payer: Self-pay | Admitting: *Deleted

## 2014-10-28 MED ORDER — METFORMIN HCL 500 MG PO TABS: ORAL_TABLET | ORAL | Status: DC

## 2014-10-28 NOTE — Telephone Encounter (Signed)
Call & informed Joelene Millin (daughter ) about dad's medication

## 2014-10-28 NOTE — Addendum Note (Signed)
Addended by: Marcial Pacas on: 10/28/2014 03:25 PM   Modules accepted: Orders

## 2014-10-28 NOTE — Telephone Encounter (Signed)
Sorry, previous Rx of 30 tabs was an error, new Rx sent to [facility].

## 2014-11-17 ENCOUNTER — Other Ambulatory Visit: Payer: Self-pay | Admitting: Family Medicine

## 2014-12-08 ENCOUNTER — Ambulatory Visit (INDEPENDENT_AMBULATORY_CARE_PROVIDER_SITE_OTHER): Payer: Commercial Managed Care - HMO | Admitting: Family Medicine

## 2014-12-08 ENCOUNTER — Ambulatory Visit (INDEPENDENT_AMBULATORY_CARE_PROVIDER_SITE_OTHER): Payer: Medicare HMO

## 2014-12-08 ENCOUNTER — Encounter: Payer: Self-pay | Admitting: Family Medicine

## 2014-12-08 VITALS — BP 117/74 | HR 73 | Wt 275.0 lb

## 2014-12-08 DIAGNOSIS — F411 Generalized anxiety disorder: Secondary | ICD-10-CM

## 2014-12-08 DIAGNOSIS — M25512 Pain in left shoulder: Secondary | ICD-10-CM

## 2014-12-08 DIAGNOSIS — Z1211 Encounter for screening for malignant neoplasm of colon: Secondary | ICD-10-CM

## 2014-12-08 DIAGNOSIS — I5022 Chronic systolic (congestive) heart failure: Secondary | ICD-10-CM

## 2014-12-08 MED ORDER — METOLAZONE 5 MG PO TABS: 5.0000 mg | ORAL_TABLET | Freq: Every day | ORAL | Status: AC | PRN

## 2014-12-08 MED ORDER — VENLAFAXINE HCL ER 75 MG PO CP24: 75.0000 mg | ORAL_CAPSULE | Freq: Every day | ORAL | Status: DC

## 2014-12-08 NOTE — Progress Notes (Signed)
CC: Maxwell Hoffman is a 69 y.o. male is here for Pt has concerns about medicine   Subjective: HPI:  He still has strong reservations about colonoscopies and has heard that there are some other ways to screen for colon cancer that her noninvasive. He wants to know if any of these are applicable to him. There is no family history of colon cancer and he's never had any polyps removed however then again he's never had a colonoscopy.  He quit drinking a few weeks ago and ever since then he's noticed that his anxiety seems to be a little bit worse. He usually feels motivated to go get out of the house for the first few hours in the morning however sometime around 11:00 he feels anxious about leaving the house and always makes up in excuse as to why he shouldn't leave. He states his depression seems to be under control right now but his anxiety and irritability seemed to be bothering him. Symptoms have improved since he started on Effexor but he believes are still room for improvement. No thoughts of wanting to harm himself or others  Complains of left shoulder pain localized in the posterior shoulder that's present only if he is internally rotating his arm and reaching behind himself and extension, pain is sudden, sharp, severe and goes away he brings his arm down back to his side. He denies any other motor or sensory disturbances or weakness in the left upper extremity and no recent or remote trauma. No neck pain or radiation of the pain.  Follow-up CHF: He's noticed that he's been taking 40 mg of Lasix every 2 or 3 days to catch up with unintentional weight gain as soon as he feels like he's made progress back down to 270 pounds he has to bump up to a larger dose of Lasix. There's been no change in fluid intake sodium intake or calorie intake. He felt short of breath if his weight is ever above 275 pounds. Denies cough, wheezing, chest pain or peripheral edema no orthopnea.    Review Of Systems Outlined  In HPI  Past Medical History  Diagnosis Date  . Hypertension   . Hyperlipidemia   . Heart attack   . Diabetes   . CAD (coronary artery disease)   . COPD (chronic obstructive pulmonary disease)     Past Surgical History  Procedure Laterality Date  . Coronary artery bypass graft      x 3  . Tonsillectomy     Family History  Problem Relation Age of Onset  . Hypertension Father     History   Social History  . Marital Status: Widowed    Spouse Name: N/A  . Number of Children: 3  . Years of Education: N/A   Occupational History  .      Retired   Social History Main Topics  . Smoking status: Current Every Day Smoker    Types: Cigarettes  . Smokeless tobacco: Not on file  . Alcohol Use: Yes     Comment: 3 drinks per night  . Drug Use: Not on file  . Sexual Activity: Not Currently   Other Topics Concern  . Not on file   Social History Narrative     Objective: BP 117/74 mmHg  Pulse 73  Wt 275 lb (124.739 kg)  Vital signs reviewed. General: Alert and Oriented, No Acute Distress HEENT: Pupils equal, round, reactive to light. Conjunctivae clear.  External ears unremarkable.  Moist mucous membranes. Lungs:  Clear and comfortable work of breathing, speaking in full sentences without accessory muscle use. Cardiac: Regular rate and rhythm.  Neuro: CN II-XII grossly intact, gait normal. Extremities: No peripheral edema.  Strong peripheral pulses.  Mental Status: No depression nor agitation. Mild anxiety. Logical though process. Skin: Warm and dry..  Assessment & Plan: Meade was seen today for pt has concerns about medicine.  Diagnoses and all orders for this visit:  Screening for colon cancer  Left shoulder pain Orders: -     DG Shoulder Left; Future  Chronic systolic congestive heart failure  Generalized anxiety disorder  Other orders -     venlafaxine XR (EFFEXOR-XR) 75 MG 24 hr capsule; Take 1 capsule (75 mg total) by mouth daily with breakfast. Take with  150mg  capsule daily. -     metolazone (ZAROXOLYN) 5 MG tablet; Take 1 tablet (5 mg total) by mouth daily as needed (excessive fluid retention.).   Screening for colon cancer: Discussed that colonoscopy is the gold standard for colon cancer screening however since he is adamant about never having one of these done will process orders for cologuard. Left shoulder pain: Suspect labral tear will rule out glenohumeral arthritis with x-rays today Generalized anxiety disorder: Uncontrolled chronic condition increasing Effexor by an additional 75 mg CHF: He seems to be yo-yoing with his diuretics therefore will be more aggressive with as needed Zaroxolyn to take for no more than 3 days straight for the present episode and then only as needed  40 minutes spent face-to-face during visit today of which at least 50% was counseling or coordinating care regarding: 1. Screening for colon cancer   2. Left shoulder pain   3. Chronic systolic congestive heart failure   4. Generalized anxiety disorder      Return for As needed for annual wellness exam.

## 2014-12-10 ENCOUNTER — Other Ambulatory Visit: Payer: Self-pay | Admitting: *Deleted

## 2014-12-10 MED ORDER — DOXAZOSIN MESYLATE 4 MG PO TABS: ORAL_TABLET | ORAL | Status: DC

## 2014-12-10 MED ORDER — ALBUTEROL SULFATE HFA 108 (90 BASE) MCG/ACT IN AERS: 2.0000 | INHALATION_SPRAY | Freq: Four times a day (QID) | RESPIRATORY_TRACT | Status: DC | PRN

## 2014-12-10 MED ORDER — TAMSULOSIN HCL 0.4 MG PO CAPS: 0.4000 mg | ORAL_CAPSULE | Freq: Every day | ORAL | Status: DC

## 2014-12-17 ENCOUNTER — Encounter: Payer: Medicare HMO | Admitting: Family Medicine

## 2014-12-27 ENCOUNTER — Other Ambulatory Visit: Payer: Self-pay | Admitting: Family Medicine

## 2014-12-28 ENCOUNTER — Other Ambulatory Visit: Payer: Self-pay | Admitting: Family Medicine

## 2014-12-30 ENCOUNTER — Other Ambulatory Visit: Payer: Self-pay | Admitting: Family Medicine

## 2015-01-25 ENCOUNTER — Other Ambulatory Visit: Payer: Self-pay | Admitting: *Deleted

## 2015-01-25 MED ORDER — CLONAZEPAM 1 MG PO TABS: ORAL_TABLET | ORAL | Status: DC

## 2015-01-26 ENCOUNTER — Other Ambulatory Visit: Payer: Self-pay | Admitting: Family Medicine

## 2015-01-27 ENCOUNTER — Ambulatory Visit (INDEPENDENT_AMBULATORY_CARE_PROVIDER_SITE_OTHER): Payer: Self-pay | Admitting: Sports Medicine

## 2015-01-27 ENCOUNTER — Ambulatory Visit (INDEPENDENT_AMBULATORY_CARE_PROVIDER_SITE_OTHER): Payer: Commercial Managed Care - HMO

## 2015-01-27 ENCOUNTER — Encounter: Payer: Self-pay | Admitting: Sports Medicine

## 2015-01-27 VITALS — BP 121/77 | HR 83 | Ht 72.0 in | Wt 273.0 lb

## 2015-01-27 DIAGNOSIS — M25511 Pain in right shoulder: Secondary | ICD-10-CM | POA: Diagnosis not present

## 2015-01-27 DIAGNOSIS — M7550 Bursitis of unspecified shoulder: Secondary | ICD-10-CM | POA: Diagnosis not present

## 2015-01-27 MED ORDER — MELOXICAM 15 MG PO TABS: ORAL_TABLET | ORAL | Status: DC

## 2015-01-27 NOTE — Progress Notes (Signed)
   Subjective:    I'm seeing this patient as a consultation for:  Dr. Ileene Rubens  CC: Bilateral shoulder pain  HPI: For the past several months this pleasant 69 year old male has had pain that he localizes over the deltoid of both shoulders, worse with overhead activities, moderate, persistent with radiation into the deltoid but nothing past the elbow, he does have a history of a right proximal humeral fracture. He takes an occasional ibuprofen. This provides some benefit but he has difficulty sleeping.  Past medical history, Surgical history, Family history not pertinant except as noted below, Social history, Allergies, and medications have been entered into the medical record, reviewed, and no changes needed.   Review of Systems: No headache, visual changes, nausea, vomiting, diarrhea, constipation, dizziness, abdominal pain, skin rash, fevers, chills, night sweats, weight loss, swollen lymph nodes, body aches, joint swelling, muscle aches, chest pain, shortness of breath, mood changes, visual or auditory hallucinations.   Objective:   General: Well Developed, well nourished, and in no acute distress.  Neuro/Psych: Alert and oriented x3, extra-ocular muscles intact, able to move all 4 extremities, sensation grossly intact. Skin: Warm and dry, no rashes noted.  Respiratory: Not using accessory muscles, speaking in full sentences, trachea midline.  Cardiovascular: Pulses palpable, no extremity edema. Abdomen: Does not appear distended. Bilateral shoulders: Inspection reveals no abnormalities, atrophy or asymmetry. Palpation is normal with no tenderness over AC joint or bicipital groove. ROM is full in all planes. Rotator cuff strength is weak abduction, particularly on the right side, where he does have a positive drop arm sign. Positive Hawkins, Neer, and empty can signs. Speeds and Yergason's tests normal. No labral pathology noted with negative Obrien's, negative crank, negative clunk, and  good stability. Normal scapular function observed. Positive painful arc bilaterally. No apprehension sign.  Procedure: Real-time Ultrasound Guided Injection of left subacromial bursa Device: GE Logiq E  Verbal informed consent obtained.  Time-out conducted.  Noted no overlying erythema, induration, or other signs of local infection.  Skin prepped in a sterile fashion.  Local anesthesia: Topical Ethyl chloride.  With sterile technique and under real time ultrasound guidance:  1 mL kenalog 40, 4 mL lidocaine injected easily. Completed without difficulty  Pain immediately resolved suggesting accurate placement of the medication.  Advised to call if fevers/chills, erythema, induration, drainage, or persistent bleeding.  Images permanently stored and available for review in the ultrasound unit.  Impression: Technically successful ultrasound guided injection.  Procedure: Real-time Ultrasound Guided Injection of right subacromial bursa Device: GE Logiq E  Verbal informed consent obtained.  Time-out conducted.  Noted no overlying erythema, induration, or other signs of local infection.  Skin prepped in a sterile fashion.  Local anesthesia: Topical Ethyl chloride.  With sterile technique and under real time ultrasound guidance: Noted evidence of what appeared to be an old greater tuberosity fracture, with good continuity of the supraspinatus, 1 mL kenalog 40, 4 mL lidocaine injected easily. Completed without difficulty  Pain immediately resolved suggesting accurate placement of the medication.  Advised to call if fevers/chills, erythema, induration, drainage, or persistent bleeding.  Images permanently stored and available for review in the ultrasound unit.  Impression: Technically successful ultrasound guided injection.  Impression and Recommendations:   This case required medical decision making of moderate complexity.

## 2015-01-27 NOTE — Assessment & Plan Note (Addendum)
Classic subacromial bursitis symptoms.  Ultrasound showed what appeared to be an old minimally displaced greater tuberosity fracture over the right humerus Rotator cuff was intact on imaging. We did bilateral subacromial injections under ultrasound guidance, we are going to x-ray the right shoulder, and I would like him to do some physical therapy for both shoulders.  Return to see me in one month, MRI if no better.

## 2015-02-09 ENCOUNTER — Ambulatory Visit: Payer: Medicare HMO | Admitting: Physical Therapy

## 2015-02-10 ENCOUNTER — Other Ambulatory Visit: Payer: Self-pay | Admitting: Family Medicine

## 2015-02-14 ENCOUNTER — Other Ambulatory Visit: Payer: Self-pay | Admitting: Family Medicine

## 2015-02-24 ENCOUNTER — Ambulatory Visit: Payer: Medicare HMO | Admitting: Sports Medicine

## 2015-04-02 ENCOUNTER — Other Ambulatory Visit: Payer: Self-pay | Admitting: Family Medicine

## 2015-04-06 ENCOUNTER — Other Ambulatory Visit: Payer: Self-pay | Admitting: Family Medicine

## 2015-04-20 ENCOUNTER — Telehealth: Payer: Self-pay | Admitting: *Deleted

## 2015-04-20 MED ORDER — FUROSEMIDE 20 MG PO TABS: 20.0000 mg | ORAL_TABLET | Freq: Every day | ORAL | Status: DC

## 2015-04-20 NOTE — Telephone Encounter (Signed)
Pt request refill of lasixs. Last amount was for # 30 and per daughter pt waw taking the medication twice a day. Advised that pt was due for a f/u appt.Will send another refill and daughter is going to schedule appt.

## 2015-04-29 ENCOUNTER — Other Ambulatory Visit: Payer: Self-pay | Admitting: Family Medicine

## 2015-05-03 ENCOUNTER — Other Ambulatory Visit: Payer: Self-pay | Admitting: Family Medicine

## 2015-05-09 ENCOUNTER — Other Ambulatory Visit: Payer: Self-pay | Admitting: Family Medicine

## 2015-05-10 ENCOUNTER — Other Ambulatory Visit: Payer: Self-pay

## 2015-05-10 MED ORDER — ATORVASTATIN CALCIUM 80 MG PO TABS: 80.0000 mg | ORAL_TABLET | Freq: Every day | ORAL | Status: DC

## 2015-05-10 NOTE — Telephone Encounter (Signed)
30 day supply sent to [facility] on [address] [place].  Patient have upcoming appointment on 05/23/15.

## 2015-05-23 ENCOUNTER — Telehealth: Payer: Self-pay | Admitting: Family Medicine

## 2015-05-23 ENCOUNTER — Ambulatory Visit: Payer: Commercial Managed Care - HMO | Admitting: Family Medicine

## 2015-05-23 NOTE — Telephone Encounter (Signed)
Patient called and need a refill for lasix had to reschedule his appt to 06/01/15 and he is a 30 min appt. Thanks

## 2015-05-24 MED ORDER — FUROSEMIDE 20 MG PO TABS: 20.0000 mg | ORAL_TABLET | Freq: Every day | ORAL | Status: DC

## 2015-05-24 NOTE — Telephone Encounter (Signed)
Mediation refilled.

## 2015-06-01 ENCOUNTER — Ambulatory Visit: Payer: Commercial Managed Care - HMO | Admitting: Family Medicine

## 2015-06-02 ENCOUNTER — Other Ambulatory Visit: Payer: Self-pay | Admitting: Family Medicine

## 2015-06-07 ENCOUNTER — Ambulatory Visit (INDEPENDENT_AMBULATORY_CARE_PROVIDER_SITE_OTHER): Payer: Commercial Managed Care - HMO | Admitting: Family Medicine

## 2015-06-07 ENCOUNTER — Encounter: Payer: Self-pay | Admitting: Family Medicine

## 2015-06-07 VITALS — BP 138/83 | HR 83 | Temp 97.5°F | Wt 271.0 lb

## 2015-06-07 DIAGNOSIS — N400 Enlarged prostate without lower urinary tract symptoms: Secondary | ICD-10-CM | POA: Diagnosis not present

## 2015-06-07 DIAGNOSIS — I10 Essential (primary) hypertension: Secondary | ICD-10-CM | POA: Diagnosis not present

## 2015-06-07 DIAGNOSIS — Z23 Encounter for immunization: Secondary | ICD-10-CM

## 2015-06-07 DIAGNOSIS — L570 Actinic keratosis: Secondary | ICD-10-CM

## 2015-06-07 DIAGNOSIS — E119 Type 2 diabetes mellitus without complications: Secondary | ICD-10-CM

## 2015-06-07 DIAGNOSIS — I509 Heart failure, unspecified: Secondary | ICD-10-CM | POA: Diagnosis not present

## 2015-06-07 DIAGNOSIS — I5022 Chronic systolic (congestive) heart failure: Secondary | ICD-10-CM

## 2015-06-07 DIAGNOSIS — F411 Generalized anxiety disorder: Secondary | ICD-10-CM

## 2015-06-07 LAB — COMPLETE METABOLIC PANEL WITH GFR
ALBUMIN: 4.2 g/dL (ref 3.6–5.1)
ALT: 34 U/L (ref 9–46)
AST: 23 U/L (ref 10–35)
Alkaline Phosphatase: 76 U/L (ref 40–115)
BUN: 20 mg/dL (ref 7–25)
CHLORIDE: 102 mmol/L (ref 98–110)
CO2: 26 mmol/L (ref 20–31)
Calcium: 9.7 mg/dL (ref 8.6–10.3)
Creat: 1.37 mg/dL — ABNORMAL HIGH (ref 0.70–1.25)
GFR, EST NON AFRICAN AMERICAN: 53 mL/min — AB (ref >=60)
GFR, Est African American: 61 mL/min (ref >=60)
GLUCOSE: 126 mg/dL — AB (ref 65–99)
POTASSIUM: 3.8 mmol/L (ref 3.5–5.3)
SODIUM: 139 mmol/L (ref 135–146)
Total Bilirubin: 0.5 mg/dL (ref 0.2–1.2)
Total Protein: 6.7 g/dL (ref 6.1–8.1)

## 2015-06-07 LAB — CBC
HCT: 46.6 % (ref 39.0–52.0)
HEMOGLOBIN: 16.1 g/dL (ref 13.0–17.0)
MCH: 33.1 pg (ref 26.0–34.0)
MCHC: 34.5 g/dL (ref 30.0–36.0)
MCV: 95.7 fL (ref 78.0–100.0)
MPV: 12.6 fL — ABNORMAL HIGH (ref 8.6–12.4)
PLATELETS: 199 10*3/uL (ref 150–400)
RBC: 4.87 MIL/uL (ref 4.22–5.81)
RDW: 13.3 % (ref 11.5–15.5)
WBC: 8.3 10*3/uL (ref 4.0–10.5)

## 2015-06-07 LAB — LIPID PANEL
Cholesterol: 165 mg/dL (ref 125–200)
HDL: 36 mg/dL — AB (ref >=40)
LDL CALC: 93 mg/dL (ref <130)
Total CHOL/HDL Ratio: 4.6 Ratio (ref <=5.0)
Triglycerides: 180 mg/dL — ABNORMAL HIGH (ref <150)
VLDL: 36 mg/dL — ABNORMAL HIGH (ref <30)

## 2015-06-07 LAB — HEMOGLOBIN A1C
HEMOGLOBIN A1C: 7.6 % — AB (ref <5.7)
Mean Plasma Glucose: 171 mg/dL — ABNORMAL HIGH (ref <117)

## 2015-06-07 MED ORDER — FUROSEMIDE 20 MG PO TABS: 40.0000 mg | ORAL_TABLET | Freq: Every day | ORAL | Status: DC

## 2015-06-07 MED ORDER — VENLAFAXINE HCL ER 150 MG PO CP24: 150.0000 mg | ORAL_CAPSULE | Freq: Every day | ORAL | Status: DC

## 2015-06-07 MED ORDER — TIOTROPIUM BROMIDE MONOHYDRATE 18 MCG IN CAPS: ORAL_CAPSULE | RESPIRATORY_TRACT | Status: DC

## 2015-06-07 MED ORDER — DOXAZOSIN MESYLATE 4 MG PO TABS: 4.0000 mg | ORAL_TABLET | Freq: Every day | ORAL | Status: DC

## 2015-06-07 MED ORDER — POTASSIUM CHLORIDE ER 10 MEQ PO CPCR: 20.0000 meq | ORAL_CAPSULE | Freq: Every day | ORAL | Status: DC

## 2015-06-07 MED ORDER — VENLAFAXINE HCL ER 75 MG PO CP24: 75.0000 mg | ORAL_CAPSULE | Freq: Every day | ORAL | Status: DC

## 2015-06-07 MED ORDER — ZOSTER VACCINE LIVE 19400 UNT/0.65ML ~~LOC~~ SOLR: 0.6500 mL | Freq: Once | SUBCUTANEOUS | Status: DC

## 2015-06-07 MED ORDER — CLONAZEPAM 1 MG PO TABS: 1.0000 mg | ORAL_TABLET | Freq: Three times a day (TID) | ORAL | Status: DC | PRN

## 2015-06-07 MED ORDER — GABAPENTIN 300 MG PO CAPS: 300.0000 mg | ORAL_CAPSULE | Freq: Every evening | ORAL | Status: DC | PRN

## 2015-06-07 NOTE — Progress Notes (Signed)
CC: Maxwell Hoffman is a 69 y.o. male is here for Medication Management   Subjective: HPI:  He and his daughter would like refills on all of his medications.  Follow-up type 2 diabetes: No outside blood sugars to report. Deniespolyuria polyphagia or polydipsia.currently he is taking metformin with 100% compliance.  BPH: He tried stopping doxazosin and has urinary urgency, hesitancy and urinary frequency. Provided he takes this and Flomax everyday he denies any nocturnal symptoms were the above symptoms.  Follow-up heart failure: He still is apprehensive about getting any surgery done to fix his heart valves. He reports shortness of breath with walking fast but during no other situation. He's been able to keep his weight steady at 270 pounds plus or -2 pounds provided he takes Lasix as prescribed every day. He is now having to take 40 mg of Lasix as opposed to 20 mg. He is occasionally having cramping in his feet but this goes away after eating a banana. He denies any chest pain or orthopnea. Denies peripheral edema  Follow-up essential hypertension: No outside blood pressures report. He has refused to take carvedilol, reasoning is unclear. He continues to take lisinopril on a daily basis.  Follow-up generalized anxiety disorder: He is taking Effexor and he believes the increased dose has helped. He still takes 2-3 Klonopin on a daily basis. Symptoms of anxiety are exacerbated only by one of his other daughters poor life choices and also by our upcoming wedding.denies depression  He has a spot on his chest a few centimeters away from where prior therapy was performed about a year ago. He says he can scratch away this little scab and it comes right back in a few days. He denies any bleeding, it's usually itchy. He denies any skin lesions elsewhere.   Review Of Systems Outlined In HPI  Past Medical History  Diagnosis Date  . Hypertension   . Hyperlipidemia   . Heart attack   . Diabetes   .  CAD (coronary artery disease)   . COPD (chronic obstructive pulmonary disease)     Past Surgical History  Procedure Laterality Date  . Coronary artery bypass graft      x 3  . Tonsillectomy     Family History  Problem Relation Age of Onset  . Hypertension Father     Social History   Social History  . Marital Status: Widowed    Spouse Name: N/A  . Number of Children: 3  . Years of Education: N/A   Occupational History  .      Retired   Social History Main Topics  . Smoking status: Current Every Day Smoker    Types: Cigarettes  . Smokeless tobacco: Not on file  . Alcohol Use: Yes     Comment: 3 drinks per night  . Drug Use: Not on file  . Sexual Activity: Not Currently   Other Topics Concern  . Not on file   Social History Narrative     Objective: BP 138/83 mmHg  Pulse 83  Temp(Src) 97.5 F (36.4 C) (Oral)  Wt 271 lb (122.925 kg)  General: Alert and Oriented, No Acute Distress HEENT: Pupils equal, round, reactive to light. Conjunctivae clear.  Moist mucous membranes pharynx unremarkable Lungs: Clear to auscultation bilaterally, no wheezing/ronchi/rales.  Comfortable work of breathing. Good air movement. Cardiac: Regular rate and rhythm. Grade 2/6 systolic murmur at left second intercostal space Abdomen:obese and soft Extremities: No peripheral edema.  Strong peripheral pulses.  Mental Status: No  depression, anxiety, nor agitation. Skin: Warm and dry. Flaking actinic keratosis on the chest to the right of the manubrium  Assessment & Plan: Maxwell Hoffman was seen today for medication management.  Diagnoses and all orders for this visit:  Type 2 diabetes mellitus without complication, without long-term current use of insulin (HCC) -     Hemoglobin A1c  BPH (benign prostatic hyperplasia)  Chronic systolic congestive heart failure (HCC) -     CBC -     COMPLETE METABOLIC PANEL WITH GFR  Essential hypertension, benign -     Lipid panel  Actinic  keratoses  Encounter for immunization  Generalized anxiety disorder  Other orders -     furosemide (LASIX) 20 MG tablet; Take 2 tablets (40 mg total) by mouth daily. -     potassium chloride (MICRO-K) 10 MEQ CR capsule; Take 2 capsules (20 mEq total) by mouth daily. -     tiotropium (SPIRIVA HANDIHALER) 18 MCG inhalation capsule; INHALE THE CONTENTS OF 1 CAPSULE VIA INHALATION DEVICE EVERY DAY -     venlafaxine XR (EFFEXOR-XR) 150 MG 24 hr capsule; Take 1 capsule (150 mg total) by mouth daily with breakfast. -     venlafaxine XR (EFFEXOR-XR) 75 MG 24 hr capsule; Take 1 capsule (75 mg total) by mouth daily with breakfast. Take with 150mg  capsule daily. -     doxazosin (CARDURA) 4 MG tablet; Take 1 tablet (4 mg total) by mouth daily. -     gabapentin (NEURONTIN) 300 MG capsule; Take 1 capsule (300 mg total) by mouth at bedtime as needed. -     clonazePAM (KLONOPIN) 1 MG tablet; Take 1 tablet (1 mg total) by mouth 3 (three) times daily as needed. for anxiety -     zoster vaccine live, PF, (ZOSTAVAX) 31517 UNT/0.65ML injection; Inject 19,400 Units into the skin once. -     Flu Vaccine QUAD 36+ mos IM   Type 2 diabetes: Clinically controlled continue metformin pending A1c BPH: controlled with Flomax and doxazosin Congestive heart failure: He is taking ACE inhibitor, he's not wanting to take a beta blocker, he is euvolemic today, no new changes to medications. Essential hypertension: Controlled continue lisinopril Generalized anxiety disorder: Controlled with clonazepam and daily Effexor. He is overdue for shingles vaccine I gave him a prescription to have this done at his pharmacy. I discussed what actinic keratosis is and what it can be, he would like it to be destroyed today with cryotherapy  Cryotherapy Procedure Note  Pre-operative Diagnosis: Actinic keratosis  Post-operative Diagnosis: Actinic keratosis  Locations: chest right of manubrium  Indications: precancerous  Anesthesia:  none  Procedure Details  History of allergy to iodine: no. Pacemaker? no.  Patient informed of risks (permanent scarring, infection, light or dark discoloration, bleeding, infection, weakness, numbness and recurrence of the lesion) and benefits of the procedure and verbal informed consent obtained.  The areas are treated with liquid nitrogen therapy, frozen until ice ball extended 2 mm beyond lesion, allowed to thaw, and treated again. The patient tolerated procedure well.  The patient was instructed on post-op care, warned that there may be blister formation, redness and pain. Recommend OTC analgesia as needed for pain.  Condition: Stable  Complications: none.  Plan: 1. Instructed to keep the area dry and covered for 24-48h and clean thereafter. 2. Warning signs of infection were reviewed.   3. Recommended that the patient use OTC analgesics as needed for pain.  4. Return PRN  40 minutes spent face-to-face during  visit today of which at least 50% was counseling or coordinating care regarding: 1. Type 2 diabetes mellitus without complication, without long-term current use of insulin ([place])   2. BPH (benign prostatic hyperplasia)   3. Chronic systolic congestive heart failure ([place])   4. Essential hypertension, benign   5. Actinic keratoses   6. Encounter for immunization   7. Generalized anxiety disorder      Return in about 3 months (around 09/07/2015).

## 2015-06-08 ENCOUNTER — Telehealth: Payer: Self-pay | Admitting: Family Medicine

## 2015-06-08 DIAGNOSIS — E119 Type 2 diabetes mellitus without complications: Secondary | ICD-10-CM

## 2015-06-08 MED ORDER — ATORVASTATIN CALCIUM 80 MG PO TABS: 80.0000 mg | ORAL_TABLET | Freq: Every day | ORAL | Status: DC

## 2015-06-08 MED ORDER — METFORMIN HCL 500 MG PO TABS: ORAL_TABLET | ORAL | Status: DC

## 2015-06-08 NOTE — Telephone Encounter (Signed)
awaiting calling back

## 2015-06-08 NOTE — Telephone Encounter (Signed)
Will you please let patient know that his A1c has risen to 7.6 with a goal of less than 7.0.  Will you please let him know that I'd recommend he take two metformin tabs in the morning and remain on one in the evening to help his blood sugar.  LDL cholesterol is controlled, a script for these will be printed off. Kidney function, liver function, and blood cell counts were normal.

## 2015-06-17 NOTE — Telephone Encounter (Signed)
Pt advised.

## 2015-09-12 ENCOUNTER — Other Ambulatory Visit: Payer: Self-pay | Admitting: Family Medicine

## 2015-09-26 ENCOUNTER — Other Ambulatory Visit: Payer: Self-pay | Admitting: Family Medicine

## 2015-10-09 ENCOUNTER — Other Ambulatory Visit: Payer: Self-pay | Admitting: Family Medicine

## 2015-10-10 ENCOUNTER — Telehealth: Payer: Self-pay | Admitting: Family Medicine

## 2015-10-10 NOTE — Telephone Encounter (Signed)
I called pt to schedule his med f/u on 10-12-15 with Dr.Hommel

## 2015-10-12 ENCOUNTER — Encounter: Payer: Self-pay | Admitting: Family Medicine

## 2015-10-12 ENCOUNTER — Ambulatory Visit (INDEPENDENT_AMBULATORY_CARE_PROVIDER_SITE_OTHER): Payer: Commercial Managed Care - HMO | Admitting: Family Medicine

## 2015-10-12 VITALS — BP 127/83 | HR 99 | Wt 260.0 lb

## 2015-10-12 DIAGNOSIS — N5089 Other specified disorders of the male genital organs: Secondary | ICD-10-CM

## 2015-10-12 DIAGNOSIS — I5022 Chronic systolic (congestive) heart failure: Secondary | ICD-10-CM

## 2015-10-12 DIAGNOSIS — M7062 Trochanteric bursitis, left hip: Secondary | ICD-10-CM | POA: Diagnosis not present

## 2015-10-12 DIAGNOSIS — E119 Type 2 diabetes mellitus without complications: Secondary | ICD-10-CM | POA: Diagnosis not present

## 2015-10-12 DIAGNOSIS — I10 Essential (primary) hypertension: Secondary | ICD-10-CM | POA: Diagnosis not present

## 2015-10-12 DIAGNOSIS — N509 Disorder of male genital organs, unspecified: Secondary | ICD-10-CM | POA: Diagnosis not present

## 2015-10-12 LAB — POCT GLYCOSYLATED HEMOGLOBIN (HGB A1C): Hemoglobin A1C: 7.3

## 2015-10-12 MED ORDER — TIOTROPIUM BROMIDE MONOHYDRATE 18 MCG IN CAPS: ORAL_CAPSULE | RESPIRATORY_TRACT | Status: DC

## 2015-10-12 MED ORDER — ATORVASTATIN CALCIUM 80 MG PO TABS: 80.0000 mg | ORAL_TABLET | Freq: Every day | ORAL | Status: DC

## 2015-10-12 MED ORDER — GABAPENTIN 300 MG PO CAPS: 300.0000 mg | ORAL_CAPSULE | Freq: Every evening | ORAL | Status: DC | PRN

## 2015-10-12 MED ORDER — FUROSEMIDE 20 MG PO TABS: 40.0000 mg | ORAL_TABLET | Freq: Every day | ORAL | Status: DC

## 2015-10-12 MED ORDER — LISINOPRIL 20 MG PO TABS: ORAL_TABLET | ORAL | Status: DC

## 2015-10-12 MED ORDER — DOXAZOSIN MESYLATE 4 MG PO TABS: 4.0000 mg | ORAL_TABLET | Freq: Every day | ORAL | Status: DC

## 2015-10-12 MED ORDER — METFORMIN HCL 500 MG PO TABS: ORAL_TABLET | ORAL | Status: DC

## 2015-10-12 MED ORDER — TAMSULOSIN HCL 0.4 MG PO CAPS: 0.4000 mg | ORAL_CAPSULE | Freq: Every day | ORAL | Status: DC

## 2015-10-12 NOTE — Addendum Note (Signed)
Addended by: Delrae Alfred on: 10/12/2015 02:53 PM   Modules accepted: Orders

## 2015-10-12 NOTE — Progress Notes (Signed)
CC: Maxwell Hoffman is a 70 y.o. male is here for Medication Refill and Hyperglycemia   Subjective: HPI:  Follow-up CHF: He denies any edema or orthopnea. He is taking Lasix 40 mg daily. He is trying his best to watch his sodium intake. He denies any current shortness of breath. He's been able lose 10 pounds since I saw him last by  decreasing his alcohol intake  Follow essential hypertension: He was getting lightheaded on full dose of lisinopril so he cut back to only 10 mg daily. Since then he is not having any more lightheadedness or balance issues  Follow-up type 2 diabetes: He is taking metformin as prescribed with 100% compliance. No outside blood sugars to report. Denies polyuria polyphagia or polydipsia.  Yesterday he noticed a mass on one of his testicles about the size of a corn kernel. He's not able to find it anymore. He denies any testicular pain, swelling or dysuria.  For the past month he's been having left hip pain localized on the lateral aspect of the hip worse when crossing the legs or walking for long periods of time. No recent or remote trauma or injury. No groin pain. Denies any radiation of the pain is described as sharp  Review of Systems Outlined In HPI  Past Medical History  Diagnosis Date  . Hypertension   . Hyperlipidemia   . Heart attack ([place])   . Diabetes ([place])   . CAD (coronary artery disease)   . COPD (chronic obstructive pulmonary disease) [facility] [place])     Past Surgical History  Procedure Laterality Date  . Coronary artery bypass graft      x 3  . Tonsillectomy     Family History  Problem Relation Age of Onset  . Hypertension Father     Social History   Social History  . Marital Status: Widowed    Spouse Name: N/A  . Number of Children: 3  . Years of Education: N/A   Occupational History  .      Retired   Social History Main Topics  . Smoking status: Current Every Day Smoker    Types: Cigarettes  . Smokeless tobacco: Not on file  .  Alcohol Use: Yes     Comment: 3 drinks per night  . Drug Use: Not on file  . Sexual Activity: Not Currently   Other Topics Concern  . Not on file   Social History Narrative     Objective: BP 127/83 mmHg  Pulse 99  Wt 260 lb (117.935 kg)  General: Alert and Oriented, No Acute Distress HEENT: Pupils equal, round, reactive to light. Conjunctivae clear.  Moist mucous membranes Lungs: Clear to auscultation bilaterally, no wheezing/ronchi/rales.  Comfortable work of breathing. Good air movement. Cardiac: Regular rate and rhythm.  grade 2/6 holosystolic murmur at left second intercostal space  Abdomen: Moderate obesity Extremities: No peripheral edema.  Strong peripheral pulses. left hip pain is reproduced with palpation of the greater trochanter.   Mental Status: No depression, anxiety, nor agitation. Skin: Warm and dry.  Assessment & Plan: Maxwell Hoffman was seen today for medication refill and hyperglycemia.  Diagnoses and all orders for this visit:  Chronic systolic congestive heart failure ([place])  Essential hypertension, benign  Type 2 diabetes mellitus without complication, without long-term current use of insulin (HCC)  Mass of testicle -     US Scrotum  Trochanteric bursitis of left hip  Other orders -     lisinopril (PRINIVIL,ZESTRIL) 20 MG tablet; Take 0.5  tablet by mouth daily -     tiotropium (SPIRIVA HANDIHALER) 18 MCG inhalation capsule; INHALE THE CONTENTS OF 1 CAPSULE VIA INHALATION DEVICE EVERY DAY    CHF: Currently controlled, euvolemic, continue on lisinopril and he does not want to take carvedilol for unknown reasons.   type 2 diabetes: Control with A1c of 7.3 continue metformin daily Essential hypertension: Controlled Questionable mass on testicle therefore obtaining bilateral ultrasound of the scrotum. Trochanteric bursitis: Is requesting injection today which seems very reasonable.  He is requesting refills on the majority of his meds now be sent to [facility]  mail order pharmacy  Trochanteric Bursitis Injection Procedure Note  Pre-operative Diagnosis: Left trochanteric bursitis  Post-operative Diagnosis: same  Indications: pain  Anesthesia: none  Procedure Details   Verbal consent was obtained for the procedure. The lateral hip was prepped with alcohol . A 27 gauge needle was advanced into the trochanteric bursa  without difficulty  The space was then injected with 2 ml 1% lidocaine and 2 ml of triamcinolone (KENALOG) 40mg /ml. The injection site was cleansed with isopropyl alcohol and a dressing was applied.  Complications:  None; patient tolerated the procedure well.    Return in about 3 months (around 01/12/2016) for sugar and bp.

## 2015-10-19 ENCOUNTER — Other Ambulatory Visit: Payer: Self-pay | Admitting: Family Medicine

## 2015-10-19 DIAGNOSIS — N5089 Other specified disorders of the male genital organs: Secondary | ICD-10-CM

## 2015-10-20 ENCOUNTER — Ambulatory Visit (INDEPENDENT_AMBULATORY_CARE_PROVIDER_SITE_OTHER): Payer: Commercial Managed Care - HMO

## 2015-10-20 ENCOUNTER — Ambulatory Visit: Payer: Commercial Managed Care - HMO

## 2015-10-20 DIAGNOSIS — N503 Cyst of epididymis: Secondary | ICD-10-CM | POA: Diagnosis not present

## 2015-10-20 DIAGNOSIS — N432 Other hydrocele: Secondary | ICD-10-CM | POA: Diagnosis not present

## 2015-10-20 DIAGNOSIS — N433 Hydrocele, unspecified: Secondary | ICD-10-CM | POA: Diagnosis not present

## 2015-10-20 DIAGNOSIS — N5089 Other specified disorders of the male genital organs: Secondary | ICD-10-CM

## 2015-11-10 ENCOUNTER — Telehealth: Payer: Self-pay

## 2015-11-10 NOTE — Telephone Encounter (Signed)
Pt called requesting a letter to be excused from jury duty.

## 2015-11-11 NOTE — Telephone Encounter (Signed)
Pt.notified

## 2015-11-11 NOTE — Telephone Encounter (Signed)
Letter in your in box. 

## 2015-11-14 ENCOUNTER — Other Ambulatory Visit: Payer: Self-pay | Admitting: Family Medicine

## 2015-12-11 ENCOUNTER — Other Ambulatory Visit: Payer: Self-pay | Admitting: Family Medicine

## 2015-12-12 DIAGNOSIS — Z0100 Encounter for examination of eyes and vision without abnormal findings: Secondary | ICD-10-CM | POA: Diagnosis not present

## 2015-12-12 DIAGNOSIS — D3130 Benign neoplasm of unspecified choroid: Secondary | ICD-10-CM | POA: Diagnosis not present

## 2015-12-14 ENCOUNTER — Encounter: Payer: Self-pay | Admitting: Family Medicine

## 2015-12-14 ENCOUNTER — Ambulatory Visit (INDEPENDENT_AMBULATORY_CARE_PROVIDER_SITE_OTHER): Payer: Commercial Managed Care - HMO | Admitting: Family Medicine

## 2015-12-14 VITALS — BP 133/87 | HR 76 | Wt 253.0 lb

## 2015-12-14 DIAGNOSIS — R51 Headache: Secondary | ICD-10-CM | POA: Diagnosis not present

## 2015-12-14 DIAGNOSIS — R519 Headache, unspecified: Secondary | ICD-10-CM

## 2015-12-14 MED ORDER — TOPIRAMATE 100 MG PO TABS: 100.0000 mg | ORAL_TABLET | Freq: Two times a day (BID) | ORAL | Status: DC

## 2015-12-14 NOTE — Progress Notes (Signed)
CC: Maxwell Hoffman is a 70 y.o. male is here for Headache and Leg Swelling   Subjective: HPI:  For the past 2 weeks he's been experiencing almost daily headache. He localizes it to the left temple it comes on suddenly and leaves him with a pounding pain. It's worse when around loud noises or bright lights. It's also accompanied by some mild nausea. The only thing that has helped so far as a BC powder however he is getting tired of having to take one of these every day. He went inside doctor on Monday and his vision is perfect and there was no signs of glaucoma. He denies any pain waking him up at night. He denies any other motor or sensory disturbances. He's had a history of migraines in his 21s that he "outgrew". Denies any hearing loss. Denies nasal congestion, sore throat, fever, chills or personality differences   Review Of Systems Outlined In HPI  Past Medical History  Diagnosis Date  . Hypertension   . Hyperlipidemia   . Heart attack ([place])   . Diabetes ([place])   . CAD (coronary artery disease)   . COPD (chronic obstructive pulmonary disease) [facility])     Past Surgical History  Procedure Laterality Date  . Coronary artery bypass graft      x 3  . Tonsillectomy     Family History  Problem Relation Age of Onset  . Hypertension Father     Social History   Social History  . Marital Status: Widowed    Spouse Name: N/A  . Number of Children: 3  . Years of Education: N/A   Occupational History  .      Retired   Social History Main Topics  . Smoking status: Current Every Day Smoker    Types: Cigarettes  . Smokeless tobacco: Not on file  . Alcohol Use: Yes     Comment: 3 drinks per night  . Drug Use: Not on file  . Sexual Activity: Not Currently   Other Topics Concern  . Not on file   Social History Narrative     Objective: BP 133/87 mmHg  Pulse 76  Wt 253 lb (114.76 kg)  Vital signs reviewed. General: Alert and Oriented, No Acute Distress HEENT: Pupils equal,  round, reactive to light. Conjunctivae clear.  External ears unremarkable.  Moist mucous membranes. Lungs: Clear and comfortable work of breathing, speaking in full sentences without accessory muscle use. Cardiac: Regular rate and rhythm.  Neuro: CN II-XII grossly intact, gait normal. Extremities: No peripheral edema.  Strong peripheral pulses.  Mental Status: No depression, anxiety, nor agitation. Logical though process. Skin: Warm and dry.  Assessment & Plan: Maxwell Hoffman was seen today for headache and leg swelling.  Diagnoses and all orders for this visit:  Nonintractable headache, unspecified chronicity pattern, unspecified headache type  Other orders -     topiramate (TOPAMAX) 100 MG tablet; Take 1 tablet (100 mg total) by mouth 2 (two) times daily.   Headache with some migrainous features therefore start Topamax. Signs and symptoms requring emergent/urgent reevaluation were discussed with the patient. Call if no better by Monday. Return if symptoms worsen or fail to improve.

## 2015-12-14 NOTE — Progress Notes (Signed)
25 minutes spent face-to-face during visit today of which at least 50% was counseling or coordinating care regarding: 1. Nonintractable headache, unspecified chronicity pattern, unspecified headache type

## 2016-01-08 ENCOUNTER — Other Ambulatory Visit: Payer: Self-pay | Admitting: Family Medicine

## 2016-01-09 ENCOUNTER — Ambulatory Visit: Payer: Commercial Managed Care - HMO | Admitting: Family Medicine

## 2016-01-16 ENCOUNTER — Ambulatory Visit (INDEPENDENT_AMBULATORY_CARE_PROVIDER_SITE_OTHER): Payer: Commercial Managed Care - HMO | Admitting: Physician Assistant

## 2016-01-16 ENCOUNTER — Encounter: Payer: Self-pay | Admitting: Physician Assistant

## 2016-01-16 VITALS — BP 114/72 | HR 90 | Ht 72.0 in | Wt 246.0 lb

## 2016-01-16 DIAGNOSIS — R2681 Unsteadiness on feet: Secondary | ICD-10-CM

## 2016-01-16 DIAGNOSIS — Z79899 Other long term (current) drug therapy: Secondary | ICD-10-CM

## 2016-01-16 DIAGNOSIS — R42 Dizziness and giddiness: Secondary | ICD-10-CM | POA: Diagnosis not present

## 2016-01-16 DIAGNOSIS — R51 Headache: Secondary | ICD-10-CM

## 2016-01-16 DIAGNOSIS — R519 Headache, unspecified: Secondary | ICD-10-CM

## 2016-01-16 MED ORDER — TRAMADOL HCL 50 MG PO TABS: 50.0000 mg | ORAL_TABLET | Freq: Three times a day (TID) | ORAL | Status: DC | PRN

## 2016-01-16 MED ORDER — SUMATRIPTAN SUCCINATE 100 MG PO TABS: 100.0000 mg | ORAL_TABLET | ORAL | Status: DC | PRN

## 2016-01-16 NOTE — Progress Notes (Signed)
   Subjective:    Patient ID: Maxwell Hoffman, male    DOB: [birth year], 70 y.o.   MRN: [id number]  HPI  Patient is a 70 year old male who presents to the clinic with his daughter. He comes in to discuss his ongoing headaches. He was seen by Dr. Barbaraann Barthel on 12/14/2015. He was started on Topamax 100 mg twice a day at that time. When he started Topamax he felt like that headaches improved a little for the first 2 weeks but then it was like taking "candy". His headaches started back in April 2017. He is having on average 5 a day. He describes them as sharp pains that progressed into pounding headaches. These headaches hit and then they resolve and then hit again. They seem to concentrate in his left temple and left side and then go out through his left forehead. BC powders are the only thing that helped it a little bit. He is taking multiple BC powders a day. His headaches can wake him up in the morning and then go away. Another one occurs around 11:00 and then it will go away. Another headache happens around 2-3 and then resolves. And then another one occurs around 5-6. Occasionally he will have one before bed. He has been to the eye doctor and given a clean bill of health. He has some mild nausea but no vomiting. He does feel like he is more dizzy and his gait has changed. He very strongly wants to be referred to a neurologist. He reports this he is in a lot of pain and cannot not get any relief.    Review of Systems  All other systems reviewed and are negative.      Objective:   Physical Exam  Constitutional: He is oriented to person, place, and time. He appears well-developed and well-nourished.  HENT:  Head: Normocephalic and atraumatic.  Right Ear: External ear normal.  Left Ear: External ear normal.  Eyes: Conjunctivae and EOM are normal. Pupils are equal, round, and reactive to light.  Neck: Normal range of motion. Neck supple.  Cardiovascular: Normal rate, regular rhythm and normal heart  sounds.   Pulmonary/Chest: Effort normal and breath sounds normal.  Lymphadenopathy:    He has no cervical adenopathy.  Neurological: He is alert and oriented to person, place, and time. No cranial nerve deficit.  Slight unsteady gait in office today and slow to get out of chair.   Psychiatric: He has a normal mood and affect. His behavior is normal.          Assessment & Plan:  Non intractable HA- pt is very adamant about neurology referral today. I will send to Dr. Lawrence Marseilles. Start imitrex tablets at onset of headaches, advised not to take more than 2 in 24 hour period. Discussed injection of sumatriptin but will opt for tablets first. Symptoms concerning for cluster like headaches. Could consider oxygen supplementation and see if helps. Increased gabapentin to 600mg  at bedtime.  Pt discussed in detail how he needed something for pain. He stated "doctors never give anything for pain anymore". Tramadol sent #20 to use as needed for pain not relieved by imitrex or BC powder. Discussed sedation warning.    Unstable gait/dizziness- will schedule to PT to work with patient on stability.   Medication management- I did not see any medications that he could come off today other than klonapin.he was not willing to d/c this medication. Deferred to PCP for management. Pt will make appt.

## 2016-01-17 ENCOUNTER — Ambulatory Visit: Payer: Commercial Managed Care - HMO | Admitting: Family Medicine

## 2016-01-17 DIAGNOSIS — R2681 Unsteadiness on feet: Secondary | ICD-10-CM | POA: Insufficient documentation

## 2016-01-17 DIAGNOSIS — R42 Dizziness and giddiness: Secondary | ICD-10-CM | POA: Insufficient documentation

## 2016-01-17 DIAGNOSIS — R519 Headache, unspecified: Secondary | ICD-10-CM | POA: Insufficient documentation

## 2016-01-17 DIAGNOSIS — R51 Headache: Secondary | ICD-10-CM

## 2016-01-19 ENCOUNTER — Telehealth: Payer: Self-pay | Admitting: *Deleted

## 2016-01-19 NOTE — Telephone Encounter (Signed)
PA submitted for sumatriptan  today  Questionnaire submitted. PA Case [id number] Status: Pending review [id number] - PA Case ID: [id number]

## 2016-01-24 ENCOUNTER — Ambulatory Visit (INDEPENDENT_AMBULATORY_CARE_PROVIDER_SITE_OTHER): Payer: Commercial Managed Care - HMO

## 2016-01-24 ENCOUNTER — Ambulatory Visit (INDEPENDENT_AMBULATORY_CARE_PROVIDER_SITE_OTHER): Payer: Commercial Managed Care - HMO | Admitting: Family Medicine

## 2016-01-24 ENCOUNTER — Encounter: Payer: Self-pay | Admitting: Family Medicine

## 2016-01-24 VITALS — BP 159/98 | HR 94 | Wt 246.0 lb

## 2016-01-24 DIAGNOSIS — I10 Essential (primary) hypertension: Secondary | ICD-10-CM

## 2016-01-24 DIAGNOSIS — J449 Chronic obstructive pulmonary disease, unspecified: Secondary | ICD-10-CM | POA: Diagnosis not present

## 2016-01-24 DIAGNOSIS — N400 Enlarged prostate without lower urinary tract symptoms: Secondary | ICD-10-CM

## 2016-01-24 DIAGNOSIS — Z0000 Encounter for general adult medical examination without abnormal findings: Secondary | ICD-10-CM | POA: Diagnosis not present

## 2016-01-24 DIAGNOSIS — R05 Cough: Secondary | ICD-10-CM | POA: Diagnosis not present

## 2016-01-24 DIAGNOSIS — F17210 Nicotine dependence, cigarettes, uncomplicated: Secondary | ICD-10-CM | POA: Diagnosis not present

## 2016-01-24 DIAGNOSIS — E119 Type 2 diabetes mellitus without complications: Secondary | ICD-10-CM | POA: Diagnosis not present

## 2016-01-24 DIAGNOSIS — Z23 Encounter for immunization: Secondary | ICD-10-CM | POA: Diagnosis not present

## 2016-01-24 MED ORDER — LISINOPRIL 20 MG PO TABS: ORAL_TABLET | ORAL | Status: DC

## 2016-01-24 NOTE — Progress Notes (Signed)
Subjective:    [name] is a 70 y.o. male who presents for Medicare Annual/Subsequent preventive examination.   Preventive Screening-Counseling & Management  Tobacco History  Smoking status  . Current Every Day Smoker  . Types: Cigarettes  Smokeless tobacco  . Not on file   Colonoscopy: Open to cologuard, not colonoscopy.  Resending orders for cologuard. Prostate: Discussed screening risks/beneifts with patient today, obtaining PSA  Influenza Vaccine: not indicated Pneumovax: unknown, he's going to look into his old records Td/Tdap: >10 years, will receive Tdap today Zoster: unknown, he's going to look into his old records    Problems Prior to Visit 1. Headaches which have resolved after stopping topamax  Current Problems (verified) Patient Active Problem List   Diagnosis Date Noted  . Cephalalgia 01/17/2016  . Unstable gait 01/17/2016  . Dizziness 01/17/2016  . Subacromial bursitis 01/27/2015  . Headache 08/17/2013  . OSA on CPAP 07/15/2013  . Basal cell carcinoma 07/15/2013  . CHF, chronic ([place]) 06/03/2013  . CAD (coronary artery disease) 04/22/2013  . Tobacco abuse 04/22/2013  . Bruit 04/22/2013  . Microalbuminuria 04/10/2013  . Mitral regurgitation 04/09/2013  . Hyperlipidemia LDL goal < 70 03/23/2013  . Hypercalcemia 01/20/2013  . S/P CABG x 3 (2006) 01/19/2013  . Type 2 diabetes mellitus ([place]) 01/19/2013  . BPH (benign prostatic hyperplasia) 01/19/2013  . Essential hypertension, benign 01/19/2013  . GERD (gastroesophageal reflux disease) 01/19/2013  . Generalized anxiety disorder 01/19/2013  . COPD, moderate ([place]) 01/19/2013    Medications Prior to Visit Current Outpatient Prescriptions on File Prior to Visit  Medication Sig Dispense Refill  . albuterol (PROVENTIL HFA;VENTOLIN HFA) 108 (90 BASE) MCG/ACT inhaler Inhale 2 puffs into the lungs every 6 (six) hours as needed for wheezing or shortness of breath. 3 Inhaler 2  . Albuterol Sulfate  (VENTOLIN HFA IN) Inhale into the lungs.    . AMBULATORY NON FORMULARY MEDICATION CPAP machine 1 each 0  . aspirin 81 MG tablet Take 81 mg by mouth daily.    Marland Kitchen atorvastatin (LIPITOR) 80 MG tablet Take 1 tablet (80 mg total) by mouth daily. 90 tablet 2  . Cholecalciferol (VITAMIN D-3) 5000 UNITS TABS Take by mouth.    . clonazePAM (KLONOPIN) 1 MG tablet TAKE 1 TABLET BY MOUTH THREE TIMES DAILY AS NEEDED 90 tablet 0  . cyclobenzaprine (FLEXERIL) 10 MG tablet TAKE 1/2 TO 1 TABLET BY MOUTH EVERY 8 TO 12 HOURS AS NEEDED FOR NECK SPASM OR HEADACHE. MAY CAUSE SEDATION 90 tablet 2  . doxazosin (CARDURA) 4 MG tablet Take 1 tablet (4 mg total) by mouth daily. 90 tablet 1  . fish oil-omega-3 fatty acids 1000 MG capsule Take 2 g by mouth 2 (two) times daily.    . furosemide (LASIX) 20 MG tablet Take 2 tablets (40 mg total) by mouth daily. 180 tablet 11  . gabapentin (NEURONTIN) 300 MG capsule Take 1 capsule (300 mg total) by mouth at bedtime as needed. 90 capsule 1  . glucose monitoring kit (FREESTYLE) monitoring kit 1 each by Does not apply route as needed for other. Use to test blood sugar fasting daily.   Please dispense test strips and lancets to last 30 days. 1 each 0  . metFORMIN (GLUCOPHAGE) 500 MG tablet Take two tabs by mouth every morning and one tab in the evening. 270 tablet 1  . metolazone (ZAROXOLYN) 5 MG tablet Take 1 tablet (5 mg total) by mouth daily as needed (excessive fluid retention.). 30 tablet 1  .  Multiple Vitamins-Minerals (CENTRUM SILVER ADULT 50+ PO) Take by mouth daily.    . potassium chloride (MICRO-K) 10 MEQ CR capsule TAKE 2 CAPSULES BY MOUTH EVERY DAY 180 capsule 0  . ranitidine (ZANTAC) 75 MG tablet Take 75 mg by mouth 2 (two) times daily.    . tamsulosin (FLOMAX) 0.4 MG CAPS capsule Take 1 capsule (0.4 mg total) by mouth daily. 90 capsule 1  . tiotropium (SPIRIVA HANDIHALER) 18 MCG inhalation capsule INHALE THE CONTENTS OF 1 CAPSULE VIA INHALATION DEVICE EVERY DAY 90 capsule 3   . topiramate (TOPAMAX) 100 MG tablet Take 1 tablet (100 mg total) by mouth 2 (two) times daily. 60 tablet 0  . venlafaxine XR (EFFEXOR-XR) 150 MG 24 hr capsule Take one capsule every morning with breakfast  Needs appt specifically for Anxiety 90 capsule 0  . venlafaxine XR (EFFEXOR-XR) 75 MG 24 hr capsule Take one capsule by mouth every morning with breakfast, take with 150 mg capsule every day   Needs appt specifically for anxiety 90 capsule 0  . zoster vaccine live, PF, (ZOSTAVAX) 21308 UNT/0.65ML injection Inject 19,400 Units into the skin once. 1 each 0   No current facility-administered medications on file prior to visit.    Current Medications (verified) Current Outpatient Prescriptions  Medication Sig Dispense Refill  . albuterol (PROVENTIL HFA;VENTOLIN HFA) 108 (90 BASE) MCG/ACT inhaler Inhale 2 puffs into the lungs every 6 (six) hours as needed for wheezing or shortness of breath. 3 Inhaler 2  . Albuterol Sulfate (VENTOLIN HFA IN) Inhale into the lungs.    . AMBULATORY NON FORMULARY MEDICATION CPAP machine 1 each 0  . aspirin 81 MG tablet Take 81 mg by mouth daily.    Marland Kitchen atorvastatin (LIPITOR) 80 MG tablet Take 1 tablet (80 mg total) by mouth daily. 90 tablet 2  . Cholecalciferol (VITAMIN D-3) 5000 UNITS TABS Take by mouth.    . clonazePAM (KLONOPIN) 1 MG tablet TAKE 1 TABLET BY MOUTH THREE TIMES DAILY AS NEEDED 90 tablet 0  . cyclobenzaprine (FLEXERIL) 10 MG tablet TAKE 1/2 TO 1 TABLET BY MOUTH EVERY 8 TO 12 HOURS AS NEEDED FOR NECK SPASM OR HEADACHE. MAY CAUSE SEDATION 90 tablet 2  . doxazosin (CARDURA) 4 MG tablet Take 1 tablet (4 mg total) by mouth daily. 90 tablet 1  . fish oil-omega-3 fatty acids 1000 MG capsule Take 2 g by mouth 2 (two) times daily.    . furosemide (LASIX) 20 MG tablet Take 2 tablets (40 mg total) by mouth daily. 180 tablet 11  . gabapentin (NEURONTIN) 300 MG capsule Take 1 capsule (300 mg total) by mouth at bedtime as needed. 90 capsule 1  . glucose  monitoring kit (FREESTYLE) monitoring kit 1 each by Does not apply route as needed for other. Use to test blood sugar fasting daily.   Please dispense test strips and lancets to last 30 days. 1 each 0  . lisinopril (PRINIVIL,ZESTRIL) 20 MG tablet Take full tablet by mouth daily 90 tablet 1  . metFORMIN (GLUCOPHAGE) 500 MG tablet Take two tabs by mouth every morning and one tab in the evening. 270 tablet 1  . metolazone (ZAROXOLYN) 5 MG tablet Take 1 tablet (5 mg total) by mouth daily as needed (excessive fluid retention.). 30 tablet 1  . Multiple Vitamins-Minerals (CENTRUM SILVER ADULT 50+ PO) Take by mouth daily.    . potassium chloride (MICRO-K) 10 MEQ CR capsule TAKE 2 CAPSULES BY MOUTH EVERY DAY 180 capsule 0  . ranitidine (ZANTAC)  75 MG tablet Take 75 mg by mouth 2 (two) times daily.    . tamsulosin (FLOMAX) 0.4 MG CAPS capsule Take 1 capsule (0.4 mg total) by mouth daily. 90 capsule 1  . tiotropium (SPIRIVA HANDIHALER) 18 MCG inhalation capsule INHALE THE CONTENTS OF 1 CAPSULE VIA INHALATION DEVICE EVERY DAY 90 capsule 3  . topiramate (TOPAMAX) 100 MG tablet Take 1 tablet (100 mg total) by mouth 2 (two) times daily. 60 tablet 0  . venlafaxine XR (EFFEXOR-XR) 150 MG 24 hr capsule Take one capsule every morning with breakfast  Needs appt specifically for Anxiety 90 capsule 0  . venlafaxine XR (EFFEXOR-XR) 75 MG 24 hr capsule Take one capsule by mouth every morning with breakfast, take with 150 mg capsule every day   Needs appt specifically for anxiety 90 capsule 0  . zoster vaccine live, PF, (ZOSTAVAX) 43329 UNT/0.65ML injection Inject 19,400 Units into the skin once. 1 each 0   No current facility-administered medications for this visit.     Allergies (verified) Other and Wax   PAST HISTORY  Family History Family History  Problem Relation Age of Onset  . Hypertension Father     Social History Social History  Substance Use Topics  . Smoking status: Current Every Day Smoker     Types: Cigarettes  . Smokeless tobacco: Not on file  . Alcohol Use: Yes     Comment: 3 drinks per night    Are there smokers in your home (other than you)?  Yes  Risk Factors Current exercise habits: The patient does not participate in regular exercise at present.  Dietary issues discussed: DASH   Cardiac risk factors: advanced age (older than 8 for men, 95 for women). Dyslipidemia, smoker  Depression Screen (Note: if answer to either of the following is "Yes", a more complete depression screening is indicated)   Q1: Over the past two weeks, have you felt down, depressed or hopeless? No  Q2: Over the past two weeks, have you felt little interest or pleasure in doing things? No  Have you lost interest or pleasure in daily life? No  Do you often feel hopeless? No  Do you cry easily over simple problems? No  Activities of Daily Living In your present state of health, do you have any difficulty performing the following activities?:  Driving? No Managing money?  No Feeding yourself? No Getting from bed to chair? No Climbing a flight of stairs? No Preparing food and eating?: No Bathing or showering? No Getting dressed: No Getting to the toilet? No Using the toilet:No Moving around from place to place: No In the past year have you fallen or had a near fall?:No   Are you sexually active?  No  Do you have more than one partner?  No  Hearing Difficulties: Yes Do you often ask people to speak up or repeat themselves? Yes Do you experience ringing or noises in your ears? No Do you have difficulty understanding soft or whispered voices? No   Do you feel that you have a problem with memory? No  Do you often misplace items? No  Do you feel safe at home?  Yes  Cognitive Testing  Alert? Yes  Normal Appearance?Yes  Oriented to person? Yes  Place? Yes   Time? Yes  Recall of three objects?  Yes  Can perform simple calculations? Yes  Displays appropriate judgment?Yes  Can read the  correct time from a watch face?Yes   Advanced Directives have been discussed with the  patient? Yes   List the Names of Other Physician/Practitioners you currently use: 1.  None  Indicate any recent Medical Services you may have received from other than [facility] providers in the past year (date may be approximate).  Immunization History  Administered Date(s) Administered  . Influenza,inj,Quad PF,36+ Mos 04/28/2013, 03/30/2014, 06/07/2015  . Pneumococcal Polysaccharide-23 04/28/2012  . Tdap 01/24/2016    Screening Tests Health Maintenance  Topic Date Due  . Hepatitis C Screening  08/21/1945  . OPHTHALMOLOGY EXAM  01/11/1957  . TETANUS/TDAP  01/11/1966  . COLONOSCOPY  01/11/1997  . ZOSTAVAX  01/12/2007  . PNA vac Low Risk Adult (2 of 2 - PCV13) 04/28/2013  . FOOT EXAM  04/09/2014  . INFLUENZA VACCINE  03/06/2016  . HEMOGLOBIN A1C  04/13/2016    All answers were reviewed with the patient and necessary referrals were made:  Marcial Pacas, DO   01/24/2016   History reviewed: allergies, current medications, past family history, past medical history, past social history, past surgical history and problem list  Review of Systems Review of Systems - General ROS: negative for - chills, fever, night sweats, weight gain or weight loss Ophthalmic ROS: negative for - decreased vision Psychological ROS: negative for - anxiety or depression ENT ROS: negative for - hearing change, nasal congestion, tinnitus or allergies Hematological and Lymphatic ROS: negative for - bleeding problems, bruising or swollen lymph nodes Breast ROS: negative Respiratory ROS: no cough, shortness of breath, or wheezing Cardiovascular ROS: no chest pain or dyspnea on exertion Gastrointestinal ROS: no abdominal pain, change in bowel habits, or black or bloody stools Genito-Urinary ROS: negative for - genital discharge, genital ulcers, incontinence or abnormal bleeding from genitals Musculoskeletal ROS: negative for -  joint pain or muscle pain Neurological ROS: negative for - headaches or memory loss Dermatological ROS: negative for lumps, mole changes, rash and skin lesion changes    Objective:     Vision by Snellen chart: right eye:20/20, left eye:20/20 Blood pressure 159/98, pulse 94, weight 246 lb (111.585 kg). Body mass index is 33.36 kg/(m^2).  General: No Acute Distress HEENT: Atraumatic, normocephalic, conjunctivae normal without scleral icterus.  No nasal discharge, hearing grossly intact, TMs with good landmarks bilaterally with no middle ear abnormalities, posterior pharynx clear without oral lesions. Neck: Supple, trachea midline, no cervical nor supraclavicular adenopathy. Pulmonary: Clear to auscultation bilaterally without wheezing, rhonchi, nor rales. Cardiac: Regular rate and rhythm.  Grade 2/6 holosystolic murmur. No rubs, nor gallops. No peripheral edema.  2+ peripheral pulses bilaterally. Abdomen: Bowel sounds normal.  No masses.  Non-tender without rebound.  Negative Murphy's sign. MSK: Grossly intact, no signs of weakness.  Full strength throughout upper and lower extremities.  Full ROM in upper and lower extremities.  No midline spinal tenderness. Neuro: Gait unremarkable, CN II-XII grossly intact.  C5-C6 Reflex 2/4 Bilaterally, L4 Reflex 2/4 Bilaterally.  Cerebellar function intact. Skin: No rashes. Psych: Alert and oriented to person/place/time.  Thought process normal. No anxiety/depression.     Assessment:     Overdue for tetanus booster likely overdue for Zostavax and pneumococcal vaccine. Counseled on smoking cessation.     Plan:     During the course of the visit the patient was educated and counseled about appropriate screening and preventive services including:    Tdap,  return in one month of unable to find records regarding Zostavax or pneumococcal vaccine  Diet review for nutrition referral? Not indicated Patient Instructions (the written plan) was given to  the patient.  Medicare Attestation I have personally reviewed: The patient's medical and social history Their use of alcohol, tobacco or illicit drugs Their current medications and supplements The patient's functional ability including ADLs,fall risks, home safety risks, cognitive, and hearing and visual impairment Diet and physical activities Evidence for depression or mood disorders  The patient's weight, height, BMI, and visual acuity have been recorded in the chart.  I have made referrals, counseling, and provided education to the patient based on review of the above and I have provided the patient with a written personalized care plan for preventive services.     Marcial Pacas, DO   01/24/2016

## 2016-01-25 ENCOUNTER — Telehealth: Payer: Self-pay | Admitting: Family Medicine

## 2016-01-25 LAB — CBC
HEMATOCRIT: 49 % (ref 38.5–50.0)
HEMOGLOBIN: 17 g/dL (ref 13.2–17.1)
MCH: 32.8 pg (ref 27.0–33.0)
MCHC: 34.7 g/dL (ref 32.0–36.0)
MCV: 94.6 fL (ref 80.0–100.0)
MPV: 13 fL — AB (ref 7.5–12.5)
Platelets: 157 10*3/uL (ref 140–400)
RBC: 5.18 MIL/uL (ref 4.20–5.80)
RDW: 13.8 % (ref 11.0–15.0)
WBC: 8.6 10*3/uL (ref 3.8–10.8)

## 2016-01-25 LAB — COMPLETE METABOLIC PANEL WITH GFR
ALBUMIN: 4.4 g/dL (ref 3.6–5.1)
ALK PHOS: 70 U/L (ref 40–115)
ALT: 26 U/L (ref 9–46)
AST: 19 U/L (ref 10–35)
BILIRUBIN TOTAL: 0.5 mg/dL (ref 0.2–1.2)
BUN: 20 mg/dL (ref 7–25)
CALCIUM: 10 mg/dL (ref 8.6–10.3)
CO2: 23 mmol/L (ref 20–31)
CREATININE: 1.25 mg/dL (ref 0.70–1.25)
Chloride: 104 mmol/L (ref 98–110)
GFR, EST AFRICAN AMERICAN: 67 mL/min (ref >=60)
GFR, EST NON AFRICAN AMERICAN: 58 mL/min — AB (ref >=60)
Glucose, Bld: 132 mg/dL — ABNORMAL HIGH (ref 65–99)
Potassium: 3.9 mmol/L (ref 3.5–5.3)
Sodium: 143 mmol/L (ref 135–146)
TOTAL PROTEIN: 6.9 g/dL (ref 6.1–8.1)

## 2016-01-25 LAB — PSA: PSA: 2.76 ng/mL (ref <=4.00)

## 2016-01-25 LAB — HEMOGLOBIN A1C
Hgb A1c MFr Bld: 6.6 % — ABNORMAL HIGH (ref <5.7)
MEAN PLASMA GLUCOSE: 143 mg/dL

## 2016-01-25 NOTE — Telephone Encounter (Signed)
Does not need PA patient needs to get the medication from [place] approved pharm. Pt is aware

## 2016-01-25 NOTE — Telephone Encounter (Signed)
Yes the nurse visit while on cardura is ok with me as long as his medication list is fully reconciled again at the start of that visit.

## 2016-01-25 NOTE — Telephone Encounter (Signed)
Recommendations left on vm 

## 2016-01-25 NOTE — Telephone Encounter (Signed)
Pt called clinic today stating prior to appt yesterday he forgot to take his doxazosin (CARDURA) 4 MG tablet. Questions if this is why his BP was elevated at OV. Pt reports when he used to take the full tab of lisinopril (PRINIVIL,ZESTRIL) 20 MG tablet his BP was drop too low so his daughter (who is a Marine scientist) cut out this Rx completely. Pt questions if he can come in for a nurse visit recheck while taking Cardura as directed.

## 2016-01-27 ENCOUNTER — Other Ambulatory Visit: Payer: Self-pay | Admitting: Family Medicine

## 2016-01-30 ENCOUNTER — Ambulatory Visit (INDEPENDENT_AMBULATORY_CARE_PROVIDER_SITE_OTHER): Payer: Commercial Managed Care - HMO | Admitting: Family Medicine

## 2016-01-30 VITALS — BP 152/73 | HR 92 | Resp 18

## 2016-01-30 DIAGNOSIS — M6248 Contracture of muscle, other site: Secondary | ICD-10-CM

## 2016-01-30 DIAGNOSIS — M62838 Other muscle spasm: Secondary | ICD-10-CM

## 2016-01-30 MED ORDER — CYCLOBENZAPRINE HCL 10 MG PO TABS: ORAL_TABLET | ORAL | Status: DC

## 2016-01-30 MED ORDER — VENLAFAXINE HCL ER 75 MG PO CP24: ORAL_CAPSULE | ORAL | Status: DC

## 2016-01-30 NOTE — Progress Notes (Signed)
   Subjective:    Patient ID: Maxwell Hoffman, male    DOB: Nov 22, 1945, 70 y.o.   MRN: [id number]  HPI Patient is here for follow up from visit 01/24/16 when BP was 159/98. Reviewed current meds and discovered he is taking Cardur 2mg  (a 1/2 of 4mg  tablet ordered) every day. Has other medication requests for refills.    Review of Systems     Objective:   Physical Exam        Assessment & Plan:  Discussed with Dr.Hommel; he will begin to take Cardura 4mg  po qd as originally ordered. pak

## 2016-01-30 NOTE — Progress Notes (Signed)
Vital signs reviewed. General: Alert and Oriented, No Acute Distress HEENT: Pupils equal, round, reactive to light. Conjunctivae clear.  External ears unremarkable.  Moist mucous membranes. Lungs: Clear and comfortable work of breathing, speaking in full sentences without accessory muscle use. Cardiac: Regular rate and rhythm.  Neuro: CN II-XII grossly intact, gait normal. Extremities: No peripheral edema.  Strong peripheral pulses.  Mental Status: No depression, anxiety, nor agitation. Logical though process. Skin: Warm and dry.

## 2016-02-01 ENCOUNTER — Other Ambulatory Visit: Payer: Self-pay | Admitting: Family Medicine

## 2016-02-05 ENCOUNTER — Other Ambulatory Visit: Payer: Self-pay | Admitting: Family Medicine

## 2016-02-06 ENCOUNTER — Ambulatory Visit: Payer: Commercial Managed Care - HMO

## 2016-02-06 ENCOUNTER — Telehealth: Payer: Self-pay

## 2016-02-06 MED ORDER — DOXAZOSIN MESYLATE 8 MG PO TABS: 8.0000 mg | ORAL_TABLET | Freq: Every day | ORAL | Status: DC

## 2016-02-06 NOTE — Telephone Encounter (Signed)
Pt reports that his bp was 158/100 today.  He had an nurse appointment today that he wasn't able to make and wanted to report to you.

## 2016-02-06 NOTE — Telephone Encounter (Signed)
Getting closer to normal.  I'd recommend he increase his doxazosin dose to 8mg  a day, I'll send a new Rx to [facility] that he can use when he runs out of doubling up on the 4mg  formulation. Please schedule a nurse visit to recheck this in one week.

## 2016-02-06 NOTE — Telephone Encounter (Signed)
Recommendations left on vm 

## 2016-05-01 ENCOUNTER — Other Ambulatory Visit: Payer: Self-pay | Admitting: *Deleted

## 2016-05-01 ENCOUNTER — Other Ambulatory Visit: Payer: Self-pay | Admitting: Family Medicine

## 2016-05-01 MED ORDER — VENLAFAXINE HCL ER 150 MG PO CP24: ORAL_CAPSULE | ORAL | 0 refills | Status: DC

## 2016-05-01 MED ORDER — VENLAFAXINE HCL ER 75 MG PO CP24: ORAL_CAPSULE | ORAL | 0 refills | Status: DC

## 2016-05-01 MED ORDER — GABAPENTIN 300 MG PO CAPS: 600.0000 mg | ORAL_CAPSULE | Freq: Every evening | ORAL | 3 refills | Status: DC | PRN

## 2016-05-04 ENCOUNTER — Other Ambulatory Visit: Payer: Self-pay | Admitting: Family Medicine

## 2016-05-07 ENCOUNTER — Ambulatory Visit: Payer: Commercial Managed Care - HMO | Admitting: Family Medicine

## 2016-05-11 ENCOUNTER — Other Ambulatory Visit: Payer: Self-pay | Admitting: Family Medicine

## 2016-05-11 ENCOUNTER — Other Ambulatory Visit: Payer: Self-pay | Admitting: Physician Assistant

## 2016-05-22 ENCOUNTER — Ambulatory Visit: Payer: Commercial Managed Care - HMO | Admitting: Family Medicine

## 2016-05-24 ENCOUNTER — Telehealth: Payer: Self-pay | Admitting: Family Medicine

## 2016-05-24 MED ORDER — TAMSULOSIN HCL 0.4 MG PO CAPS: 0.4000 mg | ORAL_CAPSULE | Freq: Every day | ORAL | 0 refills | Status: DC

## 2016-05-24 NOTE — Telephone Encounter (Signed)
Pt called. He wants refill  On his Flomax, he's down to 1 pill(former pt of Dr.  Ileene Rubens). An appointment has been scheduled for October 25th.

## 2016-05-24 NOTE — Telephone Encounter (Signed)
Refill for Flomax 0.4 mg #7 0 refills has been sent to patient pharmacy. Narcisa Ganesh,CMA

## 2016-05-25 NOTE — Telephone Encounter (Signed)
Sounds good

## 2016-05-30 ENCOUNTER — Ambulatory Visit: Payer: Commercial Managed Care - HMO | Admitting: Family Medicine

## 2016-06-01 ENCOUNTER — Encounter: Payer: Self-pay | Admitting: Family Medicine

## 2016-06-01 ENCOUNTER — Ambulatory Visit (INDEPENDENT_AMBULATORY_CARE_PROVIDER_SITE_OTHER): Payer: Commercial Managed Care - HMO | Admitting: Family Medicine

## 2016-06-01 VITALS — BP 120/78 | HR 92 | Wt 246.0 lb

## 2016-06-01 DIAGNOSIS — G4733 Obstructive sleep apnea (adult) (pediatric): Secondary | ICD-10-CM

## 2016-06-01 DIAGNOSIS — E1142 Type 2 diabetes mellitus with diabetic polyneuropathy: Secondary | ICD-10-CM | POA: Diagnosis not present

## 2016-06-01 DIAGNOSIS — I5022 Chronic systolic (congestive) heart failure: Secondary | ICD-10-CM

## 2016-06-01 DIAGNOSIS — I10 Essential (primary) hypertension: Secondary | ICD-10-CM | POA: Diagnosis not present

## 2016-06-01 DIAGNOSIS — N401 Enlarged prostate with lower urinary tract symptoms: Secondary | ICD-10-CM

## 2016-06-01 DIAGNOSIS — E785 Hyperlipidemia, unspecified: Secondary | ICD-10-CM

## 2016-06-01 DIAGNOSIS — F411 Generalized anxiety disorder: Secondary | ICD-10-CM

## 2016-06-01 DIAGNOSIS — J449 Chronic obstructive pulmonary disease, unspecified: Secondary | ICD-10-CM | POA: Diagnosis not present

## 2016-06-01 DIAGNOSIS — Z720 Tobacco use: Secondary | ICD-10-CM

## 2016-06-01 DIAGNOSIS — I25810 Atherosclerosis of coronary artery bypass graft(s) without angina pectoris: Secondary | ICD-10-CM

## 2016-06-01 DIAGNOSIS — M62838 Other muscle spasm: Secondary | ICD-10-CM | POA: Diagnosis not present

## 2016-06-01 DIAGNOSIS — Z0000 Encounter for general adult medical examination without abnormal findings: Secondary | ICD-10-CM

## 2016-06-01 DIAGNOSIS — R221 Localized swelling, mass and lump, neck: Secondary | ICD-10-CM

## 2016-06-01 LAB — POCT GLYCOSYLATED HEMOGLOBIN (HGB A1C): HEMOGLOBIN A1C: 5.6

## 2016-06-01 MED ORDER — CYCLOBENZAPRINE HCL 10 MG PO TABS: ORAL_TABLET | ORAL | 3 refills | Status: AC

## 2016-06-01 MED ORDER — TIOTROPIUM BROMIDE MONOHYDRATE 18 MCG IN CAPS: ORAL_CAPSULE | RESPIRATORY_TRACT | 3 refills | Status: AC

## 2016-06-01 MED ORDER — TAMSULOSIN HCL 0.4 MG PO CAPS: 0.4000 mg | ORAL_CAPSULE | Freq: Every day | ORAL | 0 refills | Status: AC

## 2016-06-01 MED ORDER — POTASSIUM CHLORIDE CRYS ER 20 MEQ PO TBCR: 20.0000 meq | EXTENDED_RELEASE_TABLET | Freq: Every day | ORAL | 3 refills | Status: AC

## 2016-06-01 MED ORDER — ATORVASTATIN CALCIUM 80 MG PO TABS: 80.0000 mg | ORAL_TABLET | Freq: Every day | ORAL | 3 refills | Status: AC

## 2016-06-01 MED ORDER — LISINOPRIL 20 MG PO TABS: ORAL_TABLET | ORAL | 3 refills | Status: AC

## 2016-06-01 MED ORDER — FUROSEMIDE 40 MG PO TABS: 40.0000 mg | ORAL_TABLET | Freq: Every day | ORAL | 3 refills | Status: DC

## 2016-06-01 MED ORDER — VENLAFAXINE HCL ER 150 MG PO CP24: 300.0000 mg | ORAL_CAPSULE | Freq: Every day | ORAL | 3 refills | Status: DC

## 2016-06-01 MED ORDER — TAMSULOSIN HCL 0.4 MG PO CAPS: 0.4000 mg | ORAL_CAPSULE | Freq: Every day | ORAL | 3 refills | Status: DC

## 2016-06-01 MED ORDER — VENLAFAXINE HCL ER 150 MG PO CP24: 300.0000 mg | ORAL_CAPSULE | Freq: Every day | ORAL | 0 refills | Status: AC

## 2016-06-01 MED ORDER — METFORMIN HCL 500 MG PO TABS
500.0000 mg | ORAL_TABLET | Freq: Two times a day (BID) | ORAL | 3 refills | Status: DC
Start: 2016-06-01 — End: 2016-06-11

## 2016-06-01 MED ORDER — CLONAZEPAM 1 MG PO TABS: 1.0000 mg | ORAL_TABLET | Freq: Every day | ORAL | 3 refills | Status: AC | PRN

## 2016-06-01 MED ORDER — DOXAZOSIN MESYLATE 4 MG PO TABS: 4.0000 mg | ORAL_TABLET | Freq: Every day | ORAL | 3 refills | Status: AC

## 2016-06-01 NOTE — Patient Instructions (Signed)
Thank you for coming in today. Get fasting labs.  Return in 1 month.  You should hear about the ultrasound soon.  Return sooner if needed.   Call or go to the emergency room if you get worse, have trouble breathing, have chest pains, or palpitations.

## 2016-06-01 NOTE — Progress Notes (Signed)
Maxwell Hoffman is a 70 y.o. male who presents to [facility] today for   Follow-up diabetes, BPH COPD, coronary artery disease, hypertension, anxiety and smoking.  Diabetes. Currently the patient takes 500 mg twice daily. He feels well with no polyuria polydipsia or hypoglycemic episodes. He does note an associated symptom of paresthesias to his feet bilaterally which he manages with gabapentin.  BPH: Patient has a history of BPH currently managed with both Flomax and 4 mg of doxazosin. He does note some mild dry mouth but notes that he is able to urinate freely without urinary retention symptoms. Without both dual therapy his BPH symptoms are not well-controlled. He thinks the benefit of doxazosin is worth the annoyance.  Anxiety: Reasonably well-controlled with venlafaxine 225 mg total daily. He notes his anxiety symptoms have worsened slightly and he would like to increase to 300 mg total per day. He tolerates the medication well with no nausea upset stomach insomnia or palpitations.  Coronary artery disease/systolic heart failure/HTN: Patient has a history of coronary artery disease with multiple revascularization procedures. He takes both statin medication as well as antiplatelet therapy. Additionally uses an ACE inhibitor and furosemide.Marland Kitchen He does not have beta blocker therapy due to COPD. He feels well with no chest pain palpitations shortness of breath or significant leg swelling. He will very occasionally uses Zaroxolyn in conjunction with his furosemide for leg swelling.  Smoking: Patient is a chronic daily smoker. He has no interest in quitting smoking currently.  Patient notes a mass in the right side of his neck. This is been present for several months and has gotten larger. It is not painful however he does get irritated when he shaves. He denies significant night  sweats were explained weight loss    Past Medical History:  Diagnosis Date  . CAD (coronary artery disease)   . COPD (chronic obstructive pulmonary disease) ([place])   . Diabetes ([place])   . Heart attack   . Hyperlipidemia   . Hypertension    Past Surgical History:  Procedure Laterality Date  . CORONARY ARTERY BYPASS GRAFT     x 3  . TONSILLECTOMY     Social History  Substance Use Topics  . Smoking status: Current Every Day Smoker    Types: Cigarettes  . Smokeless tobacco: Not on file  . Alcohol use Yes     Comment: 3 drinks per night   family history includes Hypertension in his father.  ROS as above:  Medications: Current Outpatient Prescriptions  Medication Sig Dispense Refill  . Albuterol Sulfate (VENTOLIN HFA IN) Inhale into the lungs.    . AMBULATORY NON FORMULARY MEDICATION CPAP machine 1 each 0  . aspirin 81 MG tablet Take 81 mg by mouth daily.    Marland Kitchen atorvastatin (LIPITOR) 80 MG tablet Take 1 tablet (80 mg total) by mouth daily. 90 tablet 3  . Cholecalciferol (VITAMIN D-3) 5000 UNITS TABS Take by mouth.    . clonazePAM (KLONOPIN) 1 MG tablet Take 1 tablet (1 mg total) by mouth daily as needed. 30 tablet 3  . cyclobenzaprine (FLEXERIL) 10 MG tablet TAKE 1/2 TO 1 TABLET BY MOUTH EVERY 8 TO 12 HOURS AS NEEDED FOR NECK SPASM OR HEADACHE. MAY CAUSE SEDATION 90 tablet 3  . doxazosin (CARDURA) 4 MG tablet Take 1 tablet (4 mg total) by mouth daily. 90 tablet 3  . fish oil-omega-3 fatty acids 1000 MG capsule Take 2 g  by mouth 2 (two) times daily.    . furosemide (LASIX) 40 MG tablet Take 1 tablet (40 mg total) by mouth daily. 90 tablet 3  . gabapentin (NEURONTIN) 300 MG capsule Take 2 capsules (600 mg total) by mouth at bedtime as needed. 180 capsule 3  . glucose monitoring kit (FREESTYLE) monitoring kit 1 each by Does not apply route as needed for other. Use to test blood sugar fasting daily.   Please dispense test strips and lancets to last 30 days. 1 each 0  . lisinopril  (PRINIVIL,ZESTRIL) 20 MG tablet Take full tablet by mouth daily 90 tablet 3  . metFORMIN (GLUCOPHAGE) 500 MG tablet Take 1 tablet (500 mg total) by mouth 2 (two) times daily with a meal. Take two tabs by mouth every morning and one tab in the evening. 180 tablet 3  . metolazone (ZAROXOLYN) 5 MG tablet Take 1 tablet (5 mg total) by mouth daily as needed (excessive fluid retention.). 30 tablet 1  . Multiple Vitamins-Minerals (CENTRUM SILVER ADULT 50+ PO) Take by mouth daily.    . potassium chloride SA (K-DUR,KLOR-CON) 20 MEQ tablet Take 1 tablet (20 mEq total) by mouth daily. 90 tablet 3  . ranitidine (ZANTAC) 75 MG tablet Take 75 mg by mouth 2 (two) times daily.    . tamsulosin (FLOMAX) 0.4 MG CAPS capsule Take 1 capsule (0.4 mg total) by mouth daily. 30 capsule 0  . tiotropium (SPIRIVA HANDIHALER) 18 MCG inhalation capsule INHALE THE CONTENTS OF 1 CAPSULE VIA INHALATION DEVICE EVERY DAY 90 capsule 3  . venlafaxine XR (EFFEXOR-XR) 150 MG 24 hr capsule Take 2 capsules (300 mg total) by mouth daily with breakfast. 60 capsule 0  . VENTOLIN HFA 108 (90 Base) MCG/ACT inhaler INHALE 2 PUFFS EVERY 6 HOURS AS NEEDED FOR WHEEZING OR SHORTNESS OF BREATH 54 g 2   No current facility-administered medications for this visit.    Allergies  Allergen Reactions  . Other     ANT  . Wax [Beeswax]     Allergic to Bees, and Wasp    Health Maintenance Health Maintenance  Topic Date Due  . Hepatitis C Screening  1946-06-29  . OPHTHALMOLOGY EXAM  01/11/1957  . COLONOSCOPY  01/11/1997  . FOOT EXAM  04/09/2014  . INFLUENZA VACCINE  06/01/2017 (Originally 03/06/2016)  . ZOSTAVAX  06/01/2017 (Originally 01/12/2007)  . PNA vac Low Risk Adult (2 of 2 - PCV13) 06/01/2017 (Originally 04/28/2013)  . HEMOGLOBIN A1C  07/25/2016  . TETANUS/TDAP  01/23/2026     Exam:  BP 120/78   Pulse 92   Wt 246 lb (111.6 kg)   BMI 33.36 kg/m  Gen: Well NAD HEENT: EOMI,  MMM No carotid bruits present on my exam today. 1 cm mobile  firm nontender superficial nodule present right neck. Lungs: Normal work of breathing. CTABL Heart: RRR no MRG Abd: NABS, Soft. Nondistended, Nontender Exts: Brisk capillary refill, warm and well perfused.  Psych alert and oriented process and affect. Diabetic Foot Exam - Simple   Simple Foot Form Diabetic Foot exam was performed with the following findings:  Yes 06/01/2016  2:18 PM  Visual Inspection No deformities, no ulcerations, no other skin breakdown bilaterally:  Yes Sensation Testing See comments:  Yes Pulse Check Posterior Tibialis and Dorsalis pulse intact bilaterally:  Yes Comments Sensation decreased right foot compared to left.       Results for orders placed or performed in visit on 06/01/16 (from the past 72 hour(s))  POCT HgB A1C  Status: None   Collection Time: 06/01/16 12:47 PM  Result Value Ref Range   Hemoglobin A1C 5.6    No results found.    Assessment and Plan: 70 y.o. male with  Diabetes: Reasonably well-controlled. Continue current regimen. Check kidney function for use with metformin. Topic associated symptom of peripheral neuropathy. Continue gabapentin as taken. Continue to follow.  BPH: Symptoms recently well controlled with the double therapy of doxazosin and tamsulosin. He is adamant about continuing both medications. Doesn't seem to be bothering her too much I think is reasonable. Plan to check PSA in the near future.  Hypertension, heart failure, coronary artery disease: Seems to be pretty well managed. Check basic lipids and metabolic panel in the near future. Encouraged smoking cessation if possible.   neck mass: Likely lipoma. Plan for soft tissue ultrasound    anxiety: Reasonably well controlled. Increase venlafaxine to 300 mg per patient request. Patient uses his clonazepam very intermittently. Discussed safety of this medication. Discussed that he increases his risk of falls and infusion. He expresses understanding and  agreement.  Health maintenance: Patient declined all vaccines.  Orders Placed This Encounter  Procedures  . US Soft Tissue Head/Neck    Standing Status:   Future    Standing Expiration Date:   08/01/2017    Order Specific Question:   Reason for Exam (SYMPTOM  OR DIAGNOSIS REQUIRED)    Answer:   eval right neck mass. Suspect Lypoma    Order Specific Question:   Preferred imaging location?    Answer:   [facility]  . CBC  . COMPLETE METABOLIC PANEL WITH GFR  . Lipid panel  . LDL cholesterol, direct  . TSH  . PSA  . POCT HgB A1C    Discussed warning signs or symptoms. Please see discharge instructions. Patient expresses understanding.  I spent 80 minutes with this patient, greater than 50% was face-to-face time counseling regarding the above diagnosis.

## 2016-06-07 ENCOUNTER — Ambulatory Visit (INDEPENDENT_AMBULATORY_CARE_PROVIDER_SITE_OTHER): Payer: Commercial Managed Care - HMO

## 2016-06-07 DIAGNOSIS — R221 Localized swelling, mass and lump, neck: Secondary | ICD-10-CM | POA: Diagnosis not present

## 2016-06-07 DIAGNOSIS — E785 Hyperlipidemia, unspecified: Secondary | ICD-10-CM | POA: Diagnosis not present

## 2016-06-07 DIAGNOSIS — M62838 Other muscle spasm: Secondary | ICD-10-CM | POA: Diagnosis not present

## 2016-06-07 DIAGNOSIS — E1142 Type 2 diabetes mellitus with diabetic polyneuropathy: Secondary | ICD-10-CM | POA: Diagnosis not present

## 2016-06-07 DIAGNOSIS — J449 Chronic obstructive pulmonary disease, unspecified: Secondary | ICD-10-CM | POA: Diagnosis not present

## 2016-06-07 DIAGNOSIS — I5022 Chronic systolic (congestive) heart failure: Secondary | ICD-10-CM | POA: Diagnosis not present

## 2016-06-07 DIAGNOSIS — N401 Enlarged prostate with lower urinary tract symptoms: Secondary | ICD-10-CM | POA: Diagnosis not present

## 2016-06-07 DIAGNOSIS — I10 Essential (primary) hypertension: Secondary | ICD-10-CM | POA: Diagnosis not present

## 2016-06-07 DIAGNOSIS — Z0000 Encounter for general adult medical examination without abnormal findings: Secondary | ICD-10-CM | POA: Diagnosis not present

## 2016-06-08 ENCOUNTER — Telehealth: Payer: Self-pay | Admitting: Family Medicine

## 2016-06-08 ENCOUNTER — Encounter: Payer: Self-pay | Admitting: Family Medicine

## 2016-06-08 DIAGNOSIS — R221 Localized swelling, mass and lump, neck: Secondary | ICD-10-CM

## 2016-06-08 DIAGNOSIS — N183 Chronic kidney disease, stage 3 unspecified: Secondary | ICD-10-CM | POA: Insufficient documentation

## 2016-06-08 LAB — COMPLETE METABOLIC PANEL WITH GFR
ALBUMIN: 4.1 g/dL (ref 3.6–5.1)
ALK PHOS: 63 U/L (ref 40–115)
ALT: 29 U/L (ref 9–46)
AST: 22 U/L (ref 10–35)
BILIRUBIN TOTAL: 0.5 mg/dL (ref 0.2–1.2)
BUN: 17 mg/dL (ref 7–25)
CALCIUM: 10 mg/dL (ref 8.6–10.3)
CO2: 28 mmol/L (ref 20–31)
Chloride: 103 mmol/L (ref 98–110)
Creat: 1.24 mg/dL (ref 0.70–1.25)
GFR, EST AFRICAN AMERICAN: 68 mL/min (ref >=60)
GFR, EST NON AFRICAN AMERICAN: 59 mL/min — AB (ref >=60)
GLUCOSE: 108 mg/dL — AB (ref 65–99)
POTASSIUM: 3.8 mmol/L (ref 3.5–5.3)
SODIUM: 140 mmol/L (ref 135–146)
TOTAL PROTEIN: 6.2 g/dL (ref 6.1–8.1)

## 2016-06-08 LAB — LIPID PANEL
CHOLESTEROL: 145 mg/dL (ref 125–200)
HDL: 40 mg/dL (ref >=40)
LDL Cholesterol: 82 mg/dL (ref <130)
Total CHOL/HDL Ratio: 3.6 Ratio (ref <=5.0)
Triglycerides: 113 mg/dL (ref <150)
VLDL: 23 mg/dL (ref <30)

## 2016-06-08 LAB — CBC
HEMATOCRIT: 47.9 % (ref 38.5–50.0)
HEMOGLOBIN: 16.6 g/dL (ref 13.2–17.1)
MCH: 33.1 pg — AB (ref 27.0–33.0)
MCHC: 34.7 g/dL (ref 32.0–36.0)
MCV: 95.6 fL (ref 80.0–100.0)
MPV: 12.6 fL — ABNORMAL HIGH (ref 7.5–12.5)
Platelets: 149 10*3/uL (ref 140–400)
RBC: 5.01 MIL/uL (ref 4.20–5.80)
RDW: 13.8 % (ref 11.0–15.0)
WBC: 7.5 10*3/uL (ref 3.8–10.8)

## 2016-06-08 LAB — TSH: TSH: 1.29 mIU/L (ref 0.40–4.50)

## 2016-06-08 LAB — PSA: PSA: 2 ng/mL (ref <=4.0)

## 2016-06-08 LAB — LDL CHOLESTEROL, DIRECT: LDL DIRECT: 88 mg/dL (ref <130)

## 2016-06-08 NOTE — Telephone Encounter (Signed)
Ultrasound shows a somewhat cystic mass on the neck. They recommend doing a biopsy.  I've ordered this test. This will tell us what it is

## 2016-06-08 NOTE — Telephone Encounter (Signed)
Left VM for Pt to return call regarding results and recommendations, callback information provided.

## 2016-06-11 ENCOUNTER — Other Ambulatory Visit: Payer: Self-pay

## 2016-06-11 MED ORDER — METFORMIN HCL 500 MG PO TABS: ORAL_TABLET | ORAL | 3 refills | Status: DC

## 2016-07-03 ENCOUNTER — Telehealth: Payer: Self-pay

## 2016-07-03 ENCOUNTER — Other Ambulatory Visit: Payer: Self-pay

## 2016-07-03 DIAGNOSIS — R221 Localized swelling, mass and lump, neck: Secondary | ICD-10-CM

## 2016-07-03 MED ORDER — METFORMIN HCL 500 MG PO TABS: ORAL_TABLET | ORAL | 3 refills | Status: AC

## 2016-07-03 NOTE — Telephone Encounter (Signed)
A representative of [facility] called advising that they are unable to do the biopsy due to the location of mass on the neck. This procedure must be performed at the hospital per [facility]. Please advise.

## 2016-07-05 ENCOUNTER — Other Ambulatory Visit: Payer: Self-pay | Admitting: Family Medicine

## 2016-07-05 DIAGNOSIS — R221 Localized swelling, mass and lump, neck: Secondary | ICD-10-CM

## 2016-07-05 NOTE — Progress Notes (Signed)
Order placed. Please inform patient.

## 2016-07-09 ENCOUNTER — Ambulatory Visit: Payer: Commercial Managed Care - HMO | Admitting: Family Medicine

## 2016-07-12 ENCOUNTER — Ambulatory Visit: Payer: Commercial Managed Care - HMO | Admitting: Family Medicine

## 2016-07-17 ENCOUNTER — Telehealth: Payer: Self-pay | Admitting: Family Medicine

## 2016-07-17 NOTE — Telephone Encounter (Signed)
Pt has an appt tomorrow with PCP. Will decide at that visit if CT or Biopsy are appropriate. If he does get the CT, he will have it scheduled by Thursday.

## 2016-07-17 NOTE — Telephone Encounter (Signed)
-----   Message from Katha Hamming sent at 07/17/2016  1:00 PM EST ----- Regarding: ct soft tissue neck Renaud stated he would like a phone call from your office. He is concerned and confused about the order for the CT  and he has questions also about phone calls he has received about scheduling a biopsy.  It took three phone calls since last week to get in touch with him to schedule his CT.   If he doesn't get his CT by this Thursday, 07/19/16, he will need another BUN and creatinine drawn.  Thanks, Kathlene November [facility]

## 2016-07-18 ENCOUNTER — Encounter: Payer: Self-pay | Admitting: Family Medicine

## 2016-07-18 ENCOUNTER — Ambulatory Visit (INDEPENDENT_AMBULATORY_CARE_PROVIDER_SITE_OTHER): Payer: Commercial Managed Care - HMO | Admitting: Family Medicine

## 2016-07-18 ENCOUNTER — Other Ambulatory Visit: Payer: Self-pay | Admitting: Pharmacist

## 2016-07-18 VITALS — BP 131/82 | HR 97

## 2016-07-18 DIAGNOSIS — N183 Chronic kidney disease, stage 3 unspecified: Secondary | ICD-10-CM

## 2016-07-18 DIAGNOSIS — K068 Other specified disorders of gingiva and edentulous alveolar ridge: Secondary | ICD-10-CM

## 2016-07-18 DIAGNOSIS — I5022 Chronic systolic (congestive) heart failure: Secondary | ICD-10-CM | POA: Diagnosis not present

## 2016-07-18 DIAGNOSIS — R221 Localized swelling, mass and lump, neck: Secondary | ICD-10-CM | POA: Diagnosis not present

## 2016-07-18 DIAGNOSIS — I10 Essential (primary) hypertension: Secondary | ICD-10-CM

## 2016-07-18 DIAGNOSIS — I25810 Atherosclerosis of coronary artery bypass graft(s) without angina pectoris: Secondary | ICD-10-CM | POA: Diagnosis not present

## 2016-07-18 MED ORDER — FUROSEMIDE 40 MG PO TABS: 40.0000 mg | ORAL_TABLET | Freq: Two times a day (BID) | ORAL | 3 refills | Status: AC

## 2016-07-18 MED ORDER — PENICILLIN V POTASSIUM 500 MG PO TABS
500.0000 mg | ORAL_TABLET | Freq: Four times a day (QID) | ORAL | 1 refills | Status: AC
Start: 2016-07-18 — End: ?

## 2016-07-18 NOTE — Progress Notes (Signed)
Maxwell Hoffman is a 70 y.o. male who presents to [facility] today for follow-up neck mass and discuss gum pain.  Right neck mass: Patient was found to have a right neck mass months ago. This was initially treated with watchful waiting and failed to improve. He had a diagnostic soft tissue ultrasound that was heterogeneous. A recommendation that time to proceed with ultrasound-guided biopsy was obtained. Radiology requested however a CT scan of the neck prior to biopsy to be performed. Patient has questions or would like to discuss this issue further. He notes the masses continued to be present and not improved. He notes it's occasionally painful.  Jaw pain: Patient has pain in his right lower jaw. He's had several molars removed. He notes when he chews on his gums he has pain. In the past has had a severe infection in this area that required IV antibiotics. He would like a backup penicillin prescription that he can take if he develops an infection again. He's resistant to getting a partial bridge or dentures.  Diuresis: Patient notes he's had less effective diuresis recently. Previously he was doing well with 40 mg of Lasix once a day. He tried taking some dandelion extract which seemed to make things worse. He notes he has gained a little bit of weight and thinks he may be slightly fluid overloaded. He's tried taking Lasix twice a day which seems to help a lot.   Past Medical History:  Diagnosis Date  . CAD (coronary artery disease)   . COPD (chronic obstructive pulmonary disease) ([place])   . Diabetes ([place])   . Heart attack   . Hyperlipidemia   . Hypertension    Past Surgical History:  Procedure Laterality Date  . CORONARY ARTERY BYPASS GRAFT     x 3  . TONSILLECTOMY     Social History  Substance Use Topics  . Smoking status: Current Every Day Smoker    Types:  Cigarettes  . Smokeless tobacco: Not on file  . Alcohol use Yes     Comment: 3 drinks per night   family history includes Hypertension in his father.  ROS as above:  Medications: Current Outpatient Prescriptions  Medication Sig Dispense Refill  . Albuterol Sulfate (VENTOLIN HFA IN) Inhale into the lungs.    . AMBULATORY NON FORMULARY MEDICATION CPAP machine 1 each 0  . aspirin 81 MG tablet Take 81 mg by mouth daily.    Marland Kitchen atorvastatin (LIPITOR) 80 MG tablet Take 1 tablet (80 mg total) by mouth daily. 90 tablet 3  . Cholecalciferol (VITAMIN D-3) 5000 UNITS TABS Take by mouth.    . clonazePAM (KLONOPIN) 1 MG tablet Take 1 tablet (1 mg total) by mouth daily as needed. 30 tablet 3  . cyclobenzaprine (FLEXERIL) 10 MG tablet TAKE 1/2 TO 1 TABLET BY MOUTH EVERY 8 TO 12 HOURS AS NEEDED FOR NECK SPASM OR HEADACHE. MAY CAUSE SEDATION 90 tablet 3  . doxazosin (CARDURA) 4 MG tablet Take 1 tablet (4 mg total) by mouth daily. 90 tablet 3  . fish oil-omega-3 fatty acids 1000 MG capsule Take 2 g by mouth 2 (two) times daily.    . furosemide (LASIX) 40 MG tablet Take 1 tablet (40 mg total) by mouth 2 (two) times daily. 180 tablet 3  . gabapentin (NEURONTIN) 300 MG capsule Take 2 capsules (600 mg total) by mouth at bedtime as needed. 180 capsule 3  . glucose monitoring  kit (FREESTYLE) monitoring kit 1 each by Does not apply route as needed for other. Use to test blood sugar fasting daily.   Please dispense test strips and lancets to last 30 days. 1 each 0  . lisinopril (PRINIVIL,ZESTRIL) 20 MG tablet Take full tablet by mouth daily 90 tablet 3  . metFORMIN (GLUCOPHAGE) 500 MG tablet Take one tab by mouth every morning and one tab in the evening. 180 tablet 3  . metolazone (ZAROXOLYN) 5 MG tablet Take 1 tablet (5 mg total) by mouth daily as needed (excessive fluid retention.). 30 tablet 1  . Multiple Vitamins-Minerals (CENTRUM SILVER ADULT 50+ PO) Take by mouth daily.    . penicillin v potassium (VEETID) 500  MG tablet Take 1 tablet (500 mg total) by mouth 4 (four) times daily. 40 tablet 1  . potassium chloride SA (K-DUR,KLOR-CON) 20 MEQ tablet Take 1 tablet (20 mEq total) by mouth daily. 90 tablet 3  . ranitidine (ZANTAC) 75 MG tablet Take 75 mg by mouth 2 (two) times daily.    . tamsulosin (FLOMAX) 0.4 MG CAPS capsule Take 1 capsule (0.4 mg total) by mouth daily. 30 capsule 0  . tiotropium (SPIRIVA HANDIHALER) 18 MCG inhalation capsule INHALE THE CONTENTS OF 1 CAPSULE VIA INHALATION DEVICE EVERY DAY 90 capsule 3  . venlafaxine XR (EFFEXOR-XR) 150 MG 24 hr capsule Take 2 capsules (300 mg total) by mouth daily with breakfast. 60 capsule 0  . VENTOLIN HFA 108 (90 Base) MCG/ACT inhaler INHALE 2 PUFFS EVERY 6 HOURS AS NEEDED FOR WHEEZING OR SHORTNESS OF BREATH 54 g 2   No current facility-administered medications for this visit.    Allergies  Allergen Reactions  . Other     ANT  . Wax [Beeswax]     Allergic to Bees, and Wasp    Health Maintenance Health Maintenance  Topic Date Due  . INFLUENZA VACCINE  06/01/2017 (Originally 03/06/2016)  . ZOSTAVAX  06/01/2017 (Originally 01/12/2007)  . PNA vac Low Risk Adult (2 of 2 - PCV13) 06/01/2017 (Originally 04/28/2013)  . OPHTHALMOLOGY EXAM  04/06/2029 (Originally 01/11/1956)  . COLONOSCOPY  04/06/2029 (Originally 01/11/1997)  . Hepatitis C Screening  04/06/2029 (Originally 1946/01/31)  . HEMOGLOBIN A1C  11/30/2016  . FOOT EXAM  06/01/2017  . TETANUS/TDAP  01/23/2026     Exam:  BP 131/82   Pulse 97   SpO2 97%  Gen: Well NAD HEENT: EOMI,  MMM soft tissue 1-1/2 cm right neck mass present.  Absence of teeth right lower jaw gum is mildly irritated. Nontender. Lungs: Normal work of breathing. CTABL Heart: RRR no MRG Abd: NABS, Soft. Nondistended, Nontender Exts: Brisk capillary refill, warm and well perfused.    No results found for this or any previous visit (from the past 72 hour(s)). No results found.    Assessment and Plan: 70 y.o. male with    Soft tissue neck mass: Plan for CT scan of the head and neck with contrast. We'll proceed with either needle/core biopsy or excisional biopsy based on results.  Diuresis: Check metabolic panel along with BNP. Increase Lasix to twice daily.  Gum pain: Recommend dentures. Possibly infected today. Treat with penicillin as needed.  Recommend patient stop smoking.   Orders Placed This Encounter  Procedures  . COMPLETE METABOLIC PANEL WITH GFR  . B Nat Peptide    Discussed warning signs or symptoms. Please see discharge instructions. Patient expresses understanding.

## 2016-07-18 NOTE — Patient Outreach (Signed)
Outreach call to [employer] regarding his request for follow up from the [facility] Medication Adherence Campaign. Called and spoke with patient. HIPAA identifiers verified and verbal consent received.  Mr. Starosta reports that he takes his metformin as directed. Denies any missed doses or barriers to taking the medication such as cost or side effects. Reports that he did run out of metformin a couple of weeks ago when his PCP's office and [facility] were getting a prescription sorted out. However, reports that everything is now resolved and that he has his medications.  Reports that he has no further medication questions/concerns at this time. Provide patient with my phone number.  Harlow Asa, PharmD, [facility] [phone]

## 2016-07-18 NOTE — Patient Instructions (Addendum)
Thank you for coming in today. Get the CT scan today or tomorrow.  Use penicillin for dental infection as needed.  We will check kidney function and a blood test for volume overload.  Take lasix 1 pill twice daily. Do not take before bed.  Return in a few weeks to go over every thing and the results of planned biopsy.

## 2016-07-19 LAB — COMPLETE METABOLIC PANEL WITH GFR
ALBUMIN: 3.9 g/dL (ref 3.6–5.1)
ALK PHOS: 58 U/L (ref 40–115)
ALT: 23 U/L (ref 9–46)
AST: 17 U/L (ref 10–35)
BUN: 17 mg/dL (ref 7–25)
CALCIUM: 9.9 mg/dL (ref 8.6–10.3)
CHLORIDE: 105 mmol/L (ref 98–110)
CO2: 28 mmol/L (ref 20–31)
CREATININE: 1.21 mg/dL (ref 0.70–1.25)
GFR, EST AFRICAN AMERICAN: 70 mL/min (ref >=60)
GFR, Est Non African American: 61 mL/min (ref >=60)
Glucose, Bld: 123 mg/dL — ABNORMAL HIGH (ref 65–99)
POTASSIUM: 4.1 mmol/L (ref 3.5–5.3)
Sodium: 141 mmol/L (ref 135–146)
Total Bilirubin: 0.4 mg/dL (ref 0.2–1.2)
Total Protein: 6.1 g/dL (ref 6.1–8.1)

## 2016-07-19 LAB — BRAIN NATRIURETIC PEPTIDE: BRAIN NATRIURETIC PEPTIDE: 36.3 pg/mL (ref <100)

## 2016-08-20 ENCOUNTER — Telehealth: Payer: Self-pay

## 2016-08-20 DIAGNOSIS — J449 Chronic obstructive pulmonary disease, unspecified: Principal | ICD-10-CM

## 2016-08-20 DIAGNOSIS — G4733 Obstructive sleep apnea (adult) (pediatric): Secondary | ICD-10-CM

## 2016-08-20 NOTE — Telephone Encounter (Signed)
Pt called and left a VM stating the he would like an ordered placed for a CPAP. Please advise.

## 2016-08-21 NOTE — Telephone Encounter (Signed)
I don't see any documentation of a sleep study in this EMR. I think he probably had one in [place] as there is a note from 2015 about that but there are no scanned documents of the actual sleep study.  Please contact patient and and clarify if he's ever had a sleep study here or if he has had a sleep study where was it so that we can get this thing ordered.  I called and left a message with the patient to call me back as well.

## 2016-08-27 NOTE — Telephone Encounter (Signed)
Pt states that has had a CPAP for over 25 year and is in need of new equipment. He would like to have a home sleep study if possible. Advised pt that I would forward this information to Lynne Leader, MD for review and to have an order placed.

## 2016-08-29 ENCOUNTER — Telehealth: Payer: Self-pay | Admitting: *Deleted

## 2016-08-29 NOTE — Telephone Encounter (Signed)
Patient's insurance requires him to have an appointment to discuss sleep study before they proceed with referral. Patient transferred to scheduling

## 2016-08-30 ENCOUNTER — Ambulatory Visit (INDEPENDENT_AMBULATORY_CARE_PROVIDER_SITE_OTHER): Payer: Commercial Managed Care - HMO | Admitting: Family Medicine

## 2016-08-30 VITALS — BP 122/66 | HR 89 | Wt 252.0 lb

## 2016-08-30 DIAGNOSIS — J449 Chronic obstructive pulmonary disease, unspecified: Secondary | ICD-10-CM | POA: Diagnosis not present

## 2016-08-30 DIAGNOSIS — I340 Nonrheumatic mitral (valve) insufficiency: Secondary | ICD-10-CM | POA: Diagnosis not present

## 2016-08-30 DIAGNOSIS — G4733 Obstructive sleep apnea (adult) (pediatric): Secondary | ICD-10-CM | POA: Diagnosis not present

## 2016-08-30 DIAGNOSIS — F5101 Primary insomnia: Secondary | ICD-10-CM

## 2016-08-30 MED ORDER — TRAZODONE HCL 50 MG PO TABS: 25.0000 mg | ORAL_TABLET | Freq: Every evening | ORAL | 3 refills | Status: AC | PRN

## 2016-08-30 NOTE — Patient Instructions (Signed)
Thank you for coming in today. Use the trazodone as needed.  I will let Ronalee Belts know

## 2016-08-30 NOTE — Progress Notes (Signed)
Maxwell Hoffman is a 71 y.o. male who presents to [facility] today for snoring. Patient has a long history of sleep apnea and notes that his CPAP machine is no longer working. He notes significant snoring and fatigue. He has had episodes where he stops breathing at night. He's tried taking some Ambien which does help his sleep but he feels groggy the next morning. He denies fevers or chills. He is trying to arrange a repeat sleep study for CPAP authorization.   Past Medical History:  Diagnosis Date  . CAD (coronary artery disease)   . COPD (chronic obstructive pulmonary disease) ([place])   . Diabetes ([place])   . Heart attack   . Hyperlipidemia   . Hypertension    Past Surgical History:  Procedure Laterality Date  . CORONARY ARTERY BYPASS GRAFT     x 3  . TONSILLECTOMY     Social History  Substance Use Topics  . Smoking status: Current Every Day Smoker    Types: Cigarettes  . Smokeless tobacco: Not on file  . Alcohol use Yes     Comment: 3 drinks per night   family history includes Hypertension in his father.  ROS as above:  Medications: Current Outpatient Prescriptions  Medication Sig Dispense Refill  . Albuterol Sulfate (VENTOLIN HFA IN) Inhale into the lungs.    . AMBULATORY NON FORMULARY MEDICATION CPAP machine 1 each 0  . aspirin 81 MG tablet Take 81 mg by mouth daily.    Marland Kitchen atorvastatin (LIPITOR) 80 MG tablet Take 1 tablet (80 mg total) by mouth daily. 90 tablet 3  . Cholecalciferol (VITAMIN D-3) 5000 UNITS TABS Take by mouth.    . clonazePAM (KLONOPIN) 1 MG tablet Take 1 tablet (1 mg total) by mouth daily as needed. 30 tablet 3  . cyclobenzaprine (FLEXERIL) 10 MG tablet TAKE 1/2 TO 1 TABLET BY MOUTH EVERY 8 TO 12 HOURS AS NEEDED FOR NECK SPASM OR HEADACHE. MAY CAUSE SEDATION 90 tablet 3  . doxazosin (CARDURA) 4 MG tablet Take 1 tablet (4 mg total) by  mouth daily. 90 tablet 3  . fish oil-omega-3 fatty acids 1000 MG capsule Take 2 g by mouth 2 (two) times daily.    . furosemide (LASIX) 40 MG tablet Take 1 tablet (40 mg total) by mouth 2 (two) times daily. 180 tablet 3  . gabapentin (NEURONTIN) 300 MG capsule Take 2 capsules (600 mg total) by mouth at bedtime as needed. 180 capsule 3  . glucose monitoring kit (FREESTYLE) monitoring kit 1 each by Does not apply route as needed for other. Use to test blood sugar fasting daily.   Please dispense test strips and lancets to last 30 days. 1 each 0  . lisinopril (PRINIVIL,ZESTRIL) 20 MG tablet Take full tablet by mouth daily 90 tablet 3  . metFORMIN (GLUCOPHAGE) 500 MG tablet Take one tab by mouth every morning and one tab in the evening. 180 tablet 3  . metolazone (ZAROXOLYN) 5 MG tablet Take 1 tablet (5 mg total) by mouth daily as needed (excessive fluid retention.). 30 tablet 1  . Multiple Vitamins-Minerals (CENTRUM SILVER ADULT 50+ PO) Take by mouth daily.    . penicillin v potassium (VEETID) 500 MG tablet Take 1 tablet (500 mg total) by mouth 4 (four) times daily. 40 tablet 1  . potassium chloride SA (K-DUR,KLOR-CON) 20 MEQ tablet Take 1 tablet (20 mEq total) by mouth daily. 90 tablet 3  .  ranitidine (ZANTAC) 75 MG tablet Take 75 mg by mouth 2 (two) times daily.    . tamsulosin (FLOMAX) 0.4 MG CAPS capsule Take 1 capsule (0.4 mg total) by mouth daily. 30 capsule 0  . tiotropium (SPIRIVA HANDIHALER) 18 MCG inhalation capsule INHALE THE CONTENTS OF 1 CAPSULE VIA INHALATION DEVICE EVERY DAY 90 capsule 3  . traZODone (DESYREL) 50 MG tablet Take 0.5-1 tablets (25-50 mg total) by mouth at bedtime as needed for sleep. 30 tablet 3  . venlafaxine XR (EFFEXOR-XR) 150 MG 24 hr capsule Take 2 capsules (300 mg total) by mouth daily with breakfast. 60 capsule 0  . VENTOLIN HFA 108 (90 Base) MCG/ACT inhaler INHALE 2 PUFFS EVERY 6 HOURS AS NEEDED FOR WHEEZING OR SHORTNESS OF BREATH 54 g 2   No current  facility-administered medications for this visit.    Allergies  Allergen Reactions  . Other     ANT  . Wax [Beeswax]     Allergic to Bees, and Wasp    Health Maintenance Health Maintenance  Topic Date Due  . INFLUENZA VACCINE  06/01/2017 (Originally 03/06/2016)  . ZOSTAVAX  06/01/2017 (Originally 01/12/2007)  . PNA vac Low Risk Adult (2 of 2 - PCV13) 06/01/2017 (Originally 04/28/2013)  . OPHTHALMOLOGY EXAM  04/06/2029 (Originally 01/11/1956)  . COLONOSCOPY  04/06/2029 (Originally 01/11/1997)  . Hepatitis C Screening  04/06/2029 (Originally [birth year])  . HEMOGLOBIN A1C  11/30/2016  . FOOT EXAM  06/01/2017  . TETANUS/TDAP  01/23/2026     Exam:  BP 122/66   Pulse 89   Wt 252 lb (114.3 kg)   SpO2 98%   BMI 34.18 kg/m  Gen: Well NAD HEENT: EOMI,  MMM large neck normal posterior pharynx Lungs: Normal work of breathing. CTABL Heart: RRR no SYSTOLIC murmur present Abd: NABS, Soft. Nondistended, Nontender Exts: Brisk capillary refill, warm and well perfused.   STOP BANG: Snore:     Yes Tired:     Yes Observed stop breathing:  Yes Hypertension:   Yes  BMI >35:   Yes Age >50:   Yes Neck > 16 inches:  Yes Male gender:   Yes ------------------------------------------ Total:     8/8   No results found for this or any previous visit (from the past 72 hour(s)). No results found.    Assessment and Plan: 71 y.o. male with probable sleep apnea.   Will order Home Sleep Study to evaluate for sleep apnea.   Additionally we'll prescribe trazodone for insomnia currently. Try to avoid Ambien if possible.   No orders of the defined types were placed in this encounter.   Discussed warning signs or symptoms. Please see discharge instructions. Patient expresses understanding.

## 2016-08-31 ENCOUNTER — Encounter: Payer: Self-pay | Admitting: Family Medicine

## 2016-08-31 DIAGNOSIS — F5101 Primary insomnia: Secondary | ICD-10-CM | POA: Diagnosis not present

## 2016-08-31 DIAGNOSIS — I340 Nonrheumatic mitral (valve) insufficiency: Secondary | ICD-10-CM | POA: Diagnosis not present

## 2016-08-31 DIAGNOSIS — J449 Chronic obstructive pulmonary disease, unspecified: Secondary | ICD-10-CM | POA: Diagnosis not present

## 2016-08-31 DIAGNOSIS — G4733 Obstructive sleep apnea (adult) (pediatric): Secondary | ICD-10-CM | POA: Diagnosis not present

## 2016-08-31 NOTE — Progress Notes (Signed)
Note faxed to requested party via [employer].

## 2016-09-07 ENCOUNTER — Telehealth: Payer: Self-pay | Admitting: Family Medicine

## 2016-09-07 DIAGNOSIS — J449 Chronic obstructive pulmonary disease, unspecified: Principal | ICD-10-CM

## 2016-09-07 DIAGNOSIS — G4733 Obstructive sleep apnea (adult) (pediatric): Secondary | ICD-10-CM

## 2016-09-07 MED ORDER — AMBULATORY NON FORMULARY MEDICATION: 0 refills | Status: DC

## 2016-09-07 NOTE — Telephone Encounter (Signed)
Sleep Study is abnormal. CPAP ordered. Will place copy of sleep study in inbasket.

## 2016-09-10 NOTE — Telephone Encounter (Signed)
CPAP order, sleep study, demographics, insurance, and OV notes faxed to [employer] at [phone], copy will be held for 30 days. Sleep study results sent to scan.  Pt notified of results and CPAP order.

## 2016-09-26 DIAGNOSIS — G4733 Obstructive sleep apnea (adult) (pediatric): Secondary | ICD-10-CM | POA: Diagnosis not present

## 2016-10-10 ENCOUNTER — Ambulatory Visit (INDEPENDENT_AMBULATORY_CARE_PROVIDER_SITE_OTHER): Payer: Medicare HMO | Admitting: Family Medicine

## 2016-10-10 VITALS — BP 126/73 | HR 86 | Wt 248.0 lb

## 2016-10-10 DIAGNOSIS — G4733 Obstructive sleep apnea (adult) (pediatric): Secondary | ICD-10-CM | POA: Diagnosis not present

## 2016-10-10 DIAGNOSIS — E1121 Type 2 diabetes mellitus with diabetic nephropathy: Secondary | ICD-10-CM | POA: Diagnosis not present

## 2016-10-10 DIAGNOSIS — J449 Chronic obstructive pulmonary disease, unspecified: Secondary | ICD-10-CM

## 2016-10-10 MED ORDER — AMBULATORY NON FORMULARY MEDICATION: 0 refills | Status: AC

## 2016-10-10 MED ORDER — GABAPENTIN 300 MG PO CAPS: 900.0000 mg | ORAL_CAPSULE | Freq: Every evening | ORAL | 3 refills | Status: AC | PRN

## 2016-10-10 NOTE — Progress Notes (Signed)
Maxwell Hoffman is a 71 y.o. male who presents to [facility] today for follow up CPAP. Patient has sleep apnea and has been using the CPAP now for a month. He notes that he adjusted the device to start at 10cm H2O and adjust up to 20cm H2O. He thinks that is works better with these settings. He's feeling well rested.  Additionally patient would like to increase his gabapentin dose. He notes he continues to have some radicular symptoms and feels much better when he takes 900 mg at night instead of 600 mg at night.  Lastly patient notes that he will be moving to [place] after this weekend. He will establish care with physician in [place].   Past Medical History:  Diagnosis Date  . CAD (coronary artery disease)   . COPD (chronic obstructive pulmonary disease) ([place])   . Diabetes ([place])   . Heart attack   . Hyperlipidemia   . Hypertension    Past Surgical History:  Procedure Laterality Date  . CORONARY ARTERY BYPASS GRAFT     x 3  . TONSILLECTOMY     Social History  Substance Use Topics  . Smoking status: Current Every Day Smoker    Types: Cigarettes  . Smokeless tobacco: Not on file  . Alcohol use Yes     Comment: 3 drinks per night   family history includes Hypertension in his father.  ROS as above:  Medications: Current Outpatient Prescriptions  Medication Sig Dispense Refill  . Albuterol Sulfate (VENTOLIN HFA IN) Inhale into the lungs.    . AMBULATORY NON FORMULARY MEDICATION Continuous positive airway pressure (CPAP) machine auto titrate from 10 cm H2O to a max pressure of 20 cm of H2O pressure, with all supplemental supplies as needed. 1 each 0  . aspirin 81 MG tablet Take 81 mg by mouth daily.    Marland Kitchen atorvastatin (LIPITOR) 80 MG tablet Take 1 tablet (80 mg total) by mouth daily. 90 tablet 3  . Cholecalciferol (VITAMIN D-3) 5000 UNITS TABS Take by  mouth.    . clonazePAM (KLONOPIN) 1 MG tablet Take 1 tablet (1 mg total) by mouth daily as needed. 30 tablet 3  . cyclobenzaprine (FLEXERIL) 10 MG tablet TAKE 1/2 TO 1 TABLET BY MOUTH EVERY 8 TO 12 HOURS AS NEEDED FOR NECK SPASM OR HEADACHE. MAY CAUSE SEDATION 90 tablet 3  . doxazosin (CARDURA) 4 MG tablet Take 1 tablet (4 mg total) by mouth daily. 90 tablet 3  . fish oil-omega-3 fatty acids 1000 MG capsule Take 2 g by mouth 2 (two) times daily.    . furosemide (LASIX) 40 MG tablet Take 1 tablet (40 mg total) by mouth 2 (two) times daily. 180 tablet 3  . gabapentin (NEURONTIN) 300 MG capsule Take 3 capsules (900 mg total) by mouth at bedtime as needed (pain). 270 capsule 3  . glucose monitoring kit (FREESTYLE) monitoring kit 1 each by Does not apply route as needed for other. Use to test blood sugar fasting daily.   Please dispense test strips and lancets to last 30 days. 1 each 0  . lisinopril (PRINIVIL,ZESTRIL) 20 MG tablet Take full tablet by mouth daily 90 tablet 3  . metFORMIN (GLUCOPHAGE) 500 MG tablet Take one tab by mouth every morning and one tab in the evening. 180 tablet 3  . metolazone (ZAROXOLYN) 5 MG tablet Take 1 tablet (5 mg total) by mouth daily as needed (excessive fluid retention.).  30 tablet 1  . Multiple Vitamins-Minerals (CENTRUM SILVER ADULT 50+ PO) Take by mouth daily.    . penicillin v potassium (VEETID) 500 MG tablet Take 1 tablet (500 mg total) by mouth 4 (four) times daily. 40 tablet 1  . potassium chloride SA (K-DUR,KLOR-CON) 20 MEQ tablet Take 1 tablet (20 mEq total) by mouth daily. 90 tablet 3  . ranitidine (ZANTAC) 75 MG tablet Take 75 mg by mouth 2 (two) times daily.    . tamsulosin (FLOMAX) 0.4 MG CAPS capsule Take 1 capsule (0.4 mg total) by mouth daily. 30 capsule 0  . tiotropium (SPIRIVA HANDIHALER) 18 MCG inhalation capsule INHALE THE CONTENTS OF 1 CAPSULE VIA INHALATION DEVICE EVERY DAY 90 capsule 3  . traZODone (DESYREL) 50 MG tablet Take 0.5-1 tablets (25-50  mg total) by mouth at bedtime as needed for sleep. 30 tablet 3  . venlafaxine XR (EFFEXOR-XR) 150 MG 24 hr capsule Take 2 capsules (300 mg total) by mouth daily with breakfast. 60 capsule 0  . VENTOLIN HFA 108 (90 Base) MCG/ACT inhaler INHALE 2 PUFFS EVERY 6 HOURS AS NEEDED FOR WHEEZING OR SHORTNESS OF BREATH 54 g 2   No current facility-administered medications for this visit.    Allergies  Allergen Reactions  . Other     ANT  . Wax [Beeswax]     Allergic to Bees, and Wasp    Health Maintenance Health Maintenance  Topic Date Due  . INFLUENZA VACCINE  06/01/2017 (Originally 03/06/2016)  . PNA vac Low Risk Adult (2 of 2 - PCV13) 06/01/2017 (Originally 04/28/2013)  . OPHTHALMOLOGY EXAM  04/06/2029 (Originally 01/11/1956)  . COLONOSCOPY  04/06/2029 (Originally 01/11/1997)  . Hepatitis C Screening  04/06/2029 (Originally Aug 23, 1945)  . HEMOGLOBIN A1C  11/30/2016  . FOOT EXAM  06/01/2017  . TETANUS/TDAP  01/23/2026     Exam:  BP 126/73   Pulse 86   Wt 248 lb (112.5 kg)   SpO2 97%   BMI 33.63 kg/m  Gen: Well NAD HEENT: EOMI,  MMM Lungs: Normal work of breathing. CTABL Heart: RRR no MRG Abd: NABS, Soft. Nondistended, Nontender Exts: Brisk capillary refill, warm and well perfused.    No results found for this or any previous visit (from the past 72 hour(s)). No results found.    Assessment and Plan: 71 y.o. male with  Sleep apnea: Doing well. Adjust CPAP settings as described below.  Paresthesias: Doing well. Increase gabapentin.  Patient will establish care with physician in [place]. Return as needed.   No orders of the defined types were placed in this encounter.  Meds ordered this encounter  Medications  . AMBULATORY NON FORMULARY MEDICATION    Sig: Continuous positive airway pressure (CPAP) machine auto titrate from 10 cm H2O to a max pressure of 20 cm of H2O pressure, with all supplemental supplies as needed.    Dispense:  1 each    Refill:  0  . gabapentin  (NEURONTIN) 300 MG capsule    Sig: Take 3 capsules (900 mg total) by mouth at bedtime as needed (pain).    Dispense:  270 capsule    Refill:  3     Discussed warning signs or symptoms. Please see discharge instructions. Patient expresses understanding.

## 2016-10-10 NOTE — Patient Instructions (Signed)
Thank you for coming in today. Increase gabapentin.  We will send the new CPAP order to the home health agency.  Return as needed.  Make sure to establish with a doctor in [place].

## 2016-10-24 DIAGNOSIS — G4733 Obstructive sleep apnea (adult) (pediatric): Secondary | ICD-10-CM | POA: Diagnosis not present

## 2017-03-20 ENCOUNTER — Telehealth: Payer: Self-pay

## 2017-03-20 NOTE — Telephone Encounter (Signed)
Sounds like a good idea

## 2017-03-20 NOTE — Telephone Encounter (Signed)
Pt called stating that he has moved out of state and found a new PCP who advised him that the FDA has put a limitation on how much effexor is safe to take daily. Pt was previously taking 300 MG daily. His new provider advised him that the max he should take going forward is 225 MG daily. Pt would like to know if you know about and agree with these FDA guidelines. Please advise.

## 2017-03-21 NOTE — Telephone Encounter (Signed)
Left detailed vm with recommendations.

## 2017-10-19 IMAGING — US US SCROTUM
1 series · 14 of 25 positions shown · non-contrast
Comparison: None.

CLINICAL DATA: Left scrotal mass

EXAM:
SCROTAL ULTRASOUND
DOPPLER ULTRASOUND OF THE TESTICLES
TECHNIQUE: Complete ultrasound examination of the testicles, epididymis, and
other scrotal structures was performed. Color and spectral Doppler
ultrasound were also utilized to evaluate blood flow to the
testicles.

[Series 1: us scrotum · 0.07mm/px · 14 of 51 slices shown]
[im 1/51]
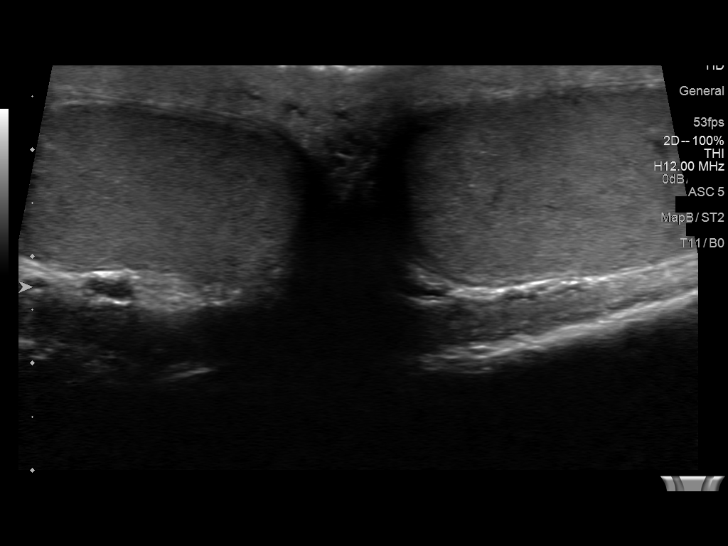
[im 5/51]
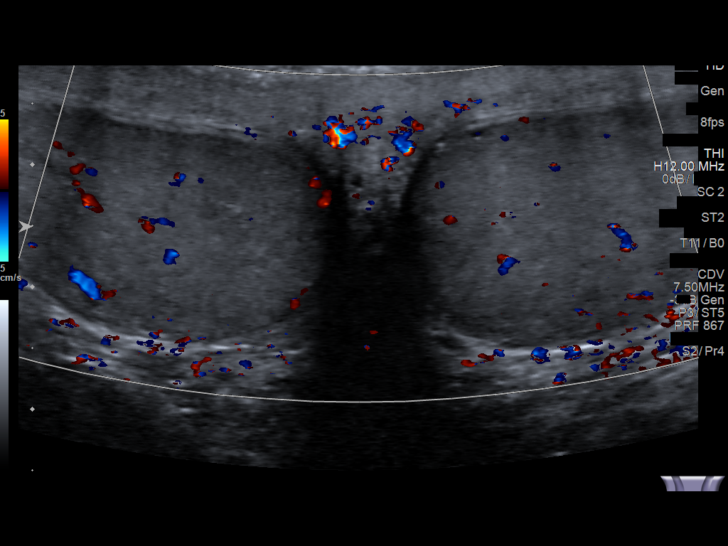
[im 9/51]
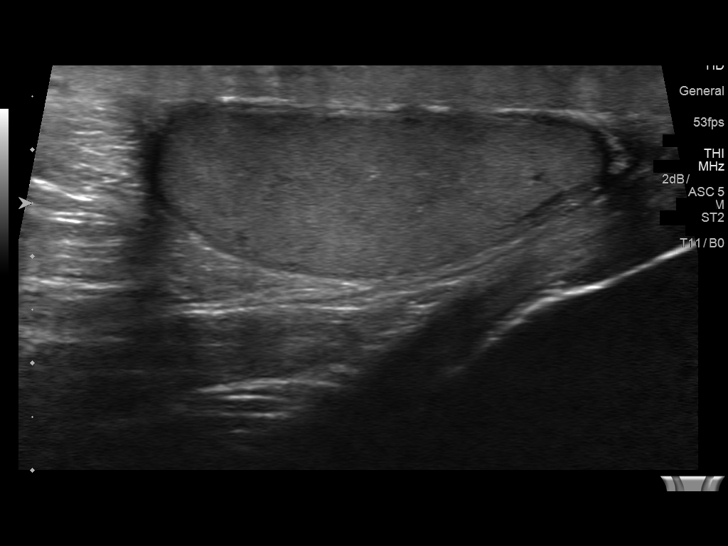
[im 13/51]
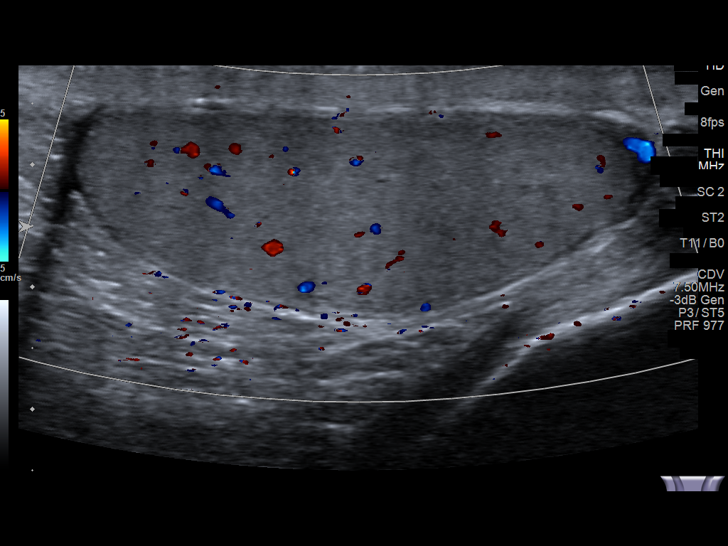
[im 17/51]
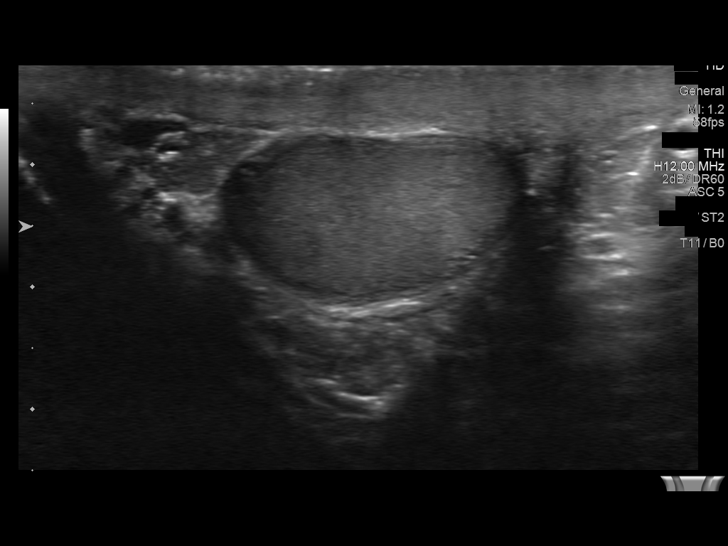
[im 19/51]
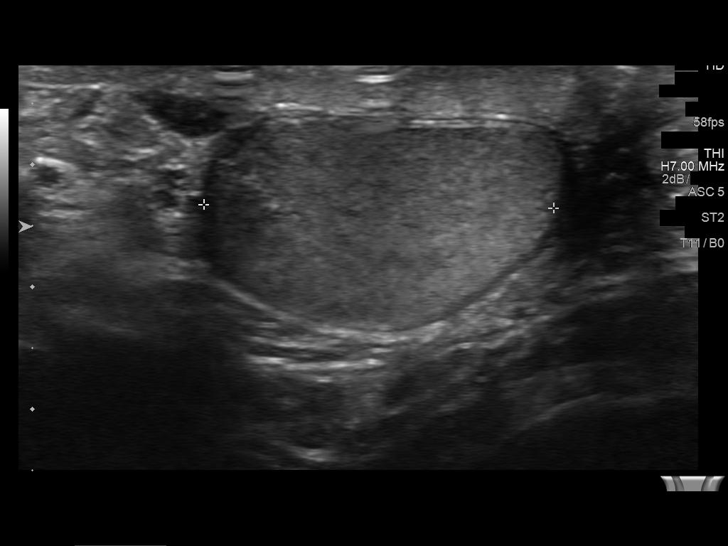
[im 23/51]
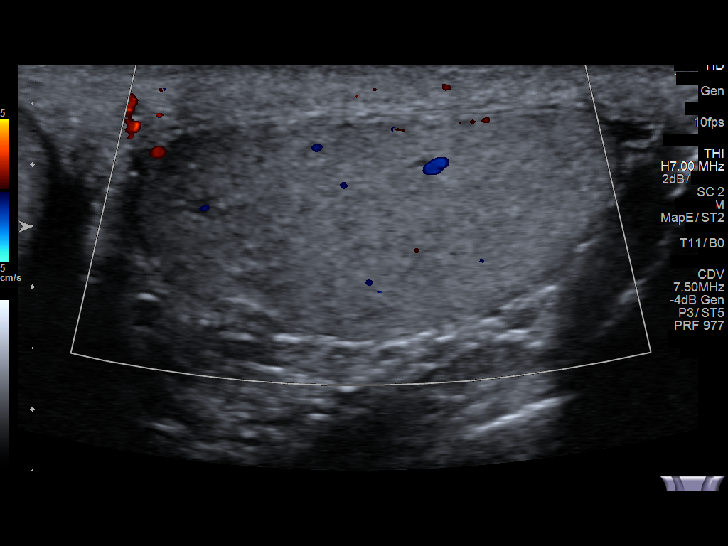
[im 28/51]
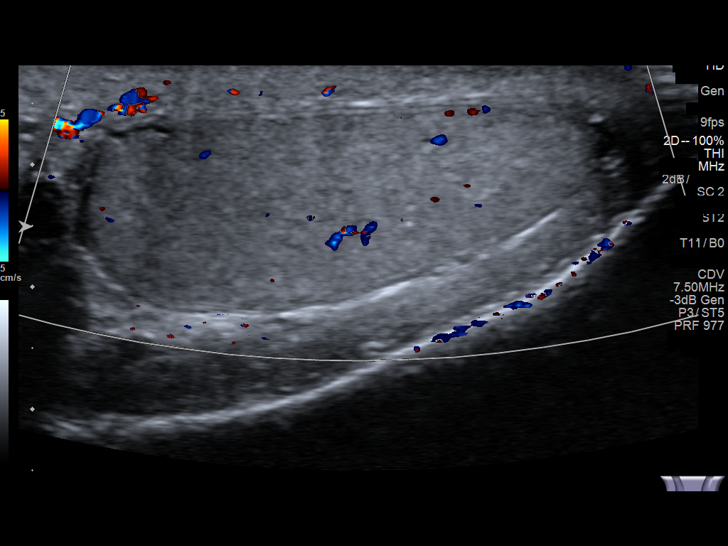
[im 32/51]
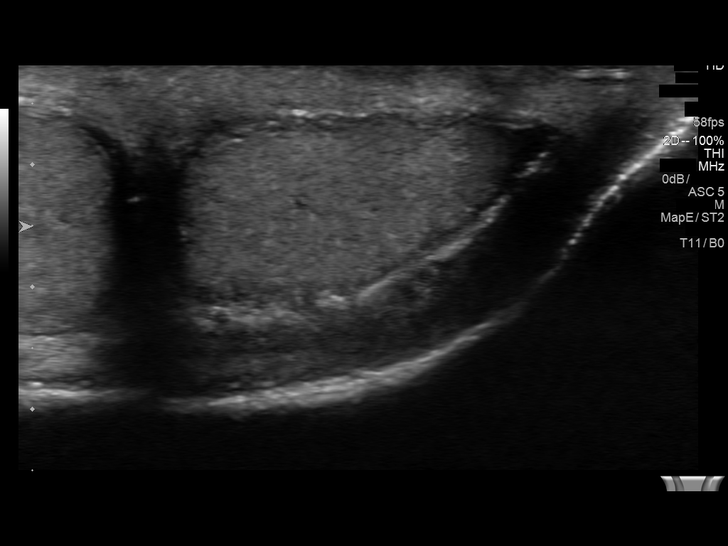
[im 34/51]
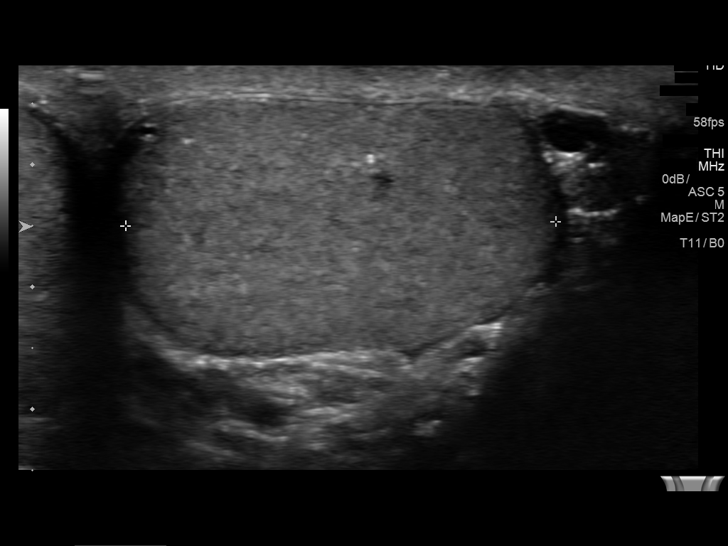
[im 38/51]
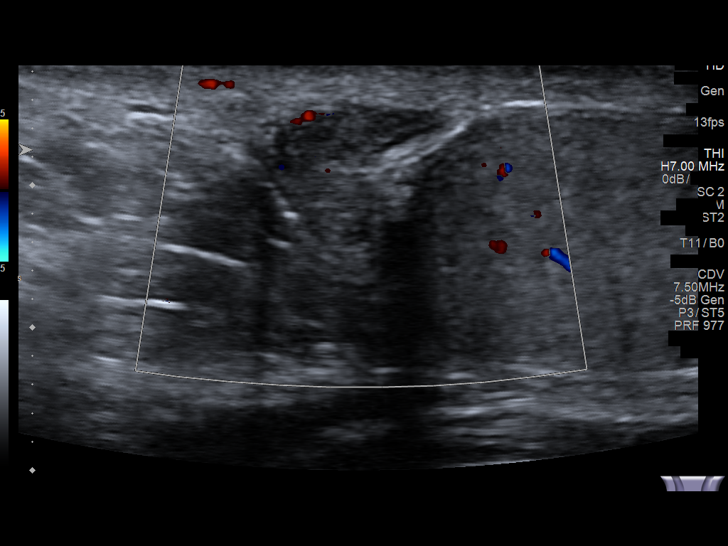
[im 42/51]
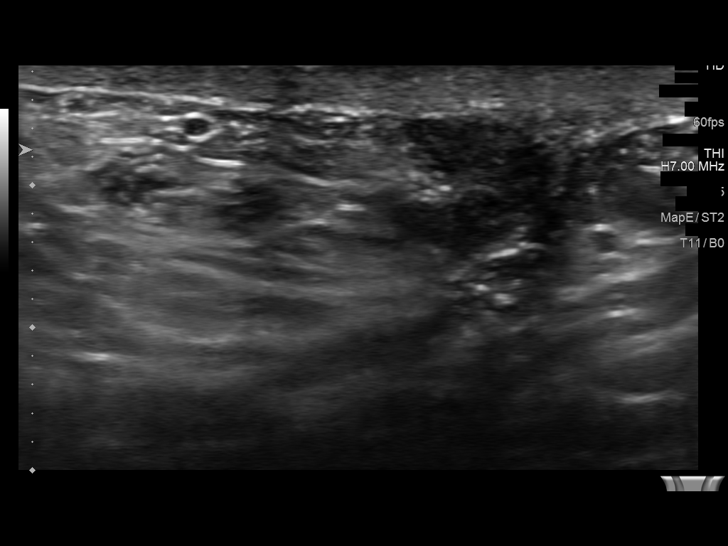
[im 46/51]
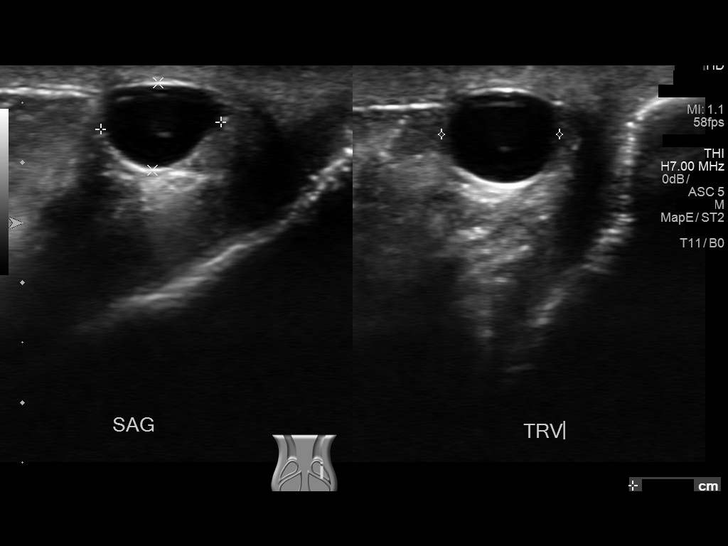
[im 51/51]
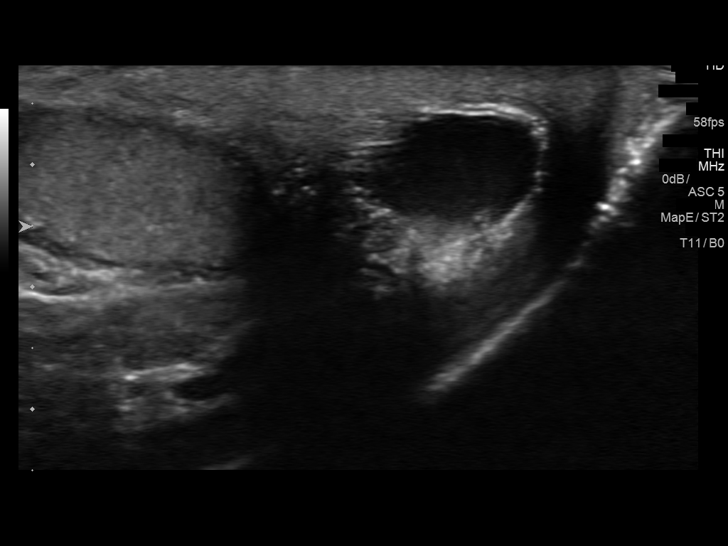

[14 of 25 positions shown; findings below may reference images not displayed]

FINDINGS: Right testicle

Measurements: 4.6 x 1.9 x 2.9 cm. No mass or microlithiasis
visualized.

Left testicle

Measurements: 4.0 x 1.8 x 3.5 cm. No mass or microlithiasis
visualized.

Right epididymis:  Normal in size and appearance.

Left epididymis: There is a 10 by 7 x 10 mm lobulated cyst within
the body of the left epididymis. This has a benign appearance.

Hydrocele:  Small left hydrocele.

Varicocele:  None visualized.

Pulsed Doppler interrogation of both testes demonstrates normal low
resistance arterial and venous waveforms bilaterally.
IMPRESSION: No evidence of testicular torsion.

Benign appearing cyst in the left epididymis is likely an epididymal
cyst or spermatocele.

Small left hydrocele.

## 2019-09-07 DEATH — deceased
